# Patient Record
Sex: Female | Born: 1975
Health system: Southern US, Community
[De-identification: ages and names within clinical notes are randomized; demographics above are authoritative.]

## PROBLEM LIST (undated history)

## (undated) DIAGNOSIS — R220 Localized swelling, mass and lump, head: Secondary | ICD-10-CM

## (undated) DIAGNOSIS — Z880 Allergy status to penicillin: Secondary | ICD-10-CM

## (undated) DIAGNOSIS — K219 Gastro-esophageal reflux disease without esophagitis: Secondary | ICD-10-CM

## (undated) DIAGNOSIS — R05 Cough: Secondary | ICD-10-CM

## (undated) DIAGNOSIS — L409 Psoriasis, unspecified: Secondary | ICD-10-CM

## (undated) DIAGNOSIS — O133 Gestational [pregnancy-induced] hypertension without significant proteinuria, third trimester: Secondary | ICD-10-CM

## (undated) DIAGNOSIS — L130 Dermatitis herpetiformis: Secondary | ICD-10-CM

## (undated) DIAGNOSIS — R058 Other specified cough: Secondary | ICD-10-CM

## (undated) DIAGNOSIS — J309 Allergic rhinitis, unspecified: Secondary | ICD-10-CM

## (undated) DIAGNOSIS — E739 Lactose intolerance, unspecified: Secondary | ICD-10-CM

## (undated) DIAGNOSIS — R002 Palpitations: Secondary | ICD-10-CM

## (undated) DIAGNOSIS — A609 Anogenital herpesviral infection, unspecified: Secondary | ICD-10-CM

## (undated) DIAGNOSIS — E559 Vitamin D deficiency, unspecified: Secondary | ICD-10-CM

## (undated) HISTORY — DX: Localized swelling, mass and lump, head: R22.0

## (undated) HISTORY — DX: Other specified cough: R05.8

## (undated) HISTORY — DX: Vitamin D deficiency, unspecified: E55.9

## (undated) HISTORY — DX: Allergy status to penicillin: Z88.0

## (undated) HISTORY — DX: Gastro-esophageal reflux disease without esophagitis: K21.9

## (undated) HISTORY — DX: Allergic rhinitis, unspecified: J30.9

## (undated) HISTORY — DX: Palpitations: R00.2

## (undated) HISTORY — DX: Dermatitis herpetiformis: L13.0

## (undated) HISTORY — DX: Cough: R05

## (undated) HISTORY — DX: Anogenital herpesviral infection, unspecified: A60.9

---

## 2003-03-13 HISTORY — PX: LEEP: SHX91

## 2005-01-04 ENCOUNTER — Other Ambulatory Visit: Admission: RE | Admit: 2005-01-04 | Discharge: 2005-01-04 | Payer: Self-pay | Admitting: Obstetrics and Gynecology

## 2006-01-24 ENCOUNTER — Other Ambulatory Visit: Admission: RE | Admit: 2006-01-24 | Discharge: 2006-01-24 | Payer: Self-pay | Admitting: Obstetrics and Gynecology

## 2006-12-16 ENCOUNTER — Other Ambulatory Visit: Admission: RE | Admit: 2006-12-16 | Discharge: 2006-12-16 | Payer: Self-pay | Admitting: Obstetrics and Gynecology

## 2007-03-13 HISTORY — PX: FEMORAL HERNIA REPAIR: SUR1179

## 2007-04-16 ENCOUNTER — Ambulatory Visit (HOSPITAL_BASED_OUTPATIENT_CLINIC_OR_DEPARTMENT_OTHER): Admission: RE | Admit: 2007-04-16 | Discharge: 2007-04-16 | Payer: Self-pay | Admitting: General Surgery

## 2007-04-16 ENCOUNTER — Encounter (HOSPITAL_BASED_OUTPATIENT_CLINIC_OR_DEPARTMENT_OTHER): Payer: Self-pay | Admitting: General Surgery

## 2008-01-14 ENCOUNTER — Encounter: Payer: Self-pay | Admitting: Obstetrics and Gynecology

## 2008-01-14 ENCOUNTER — Ambulatory Visit: Payer: Self-pay | Admitting: Obstetrics and Gynecology

## 2008-01-14 ENCOUNTER — Other Ambulatory Visit: Admission: RE | Admit: 2008-01-14 | Discharge: 2008-01-14 | Payer: Self-pay | Admitting: Obstetrics and Gynecology

## 2008-02-24 ENCOUNTER — Ambulatory Visit: Payer: Self-pay | Admitting: Gastroenterology

## 2008-02-24 DIAGNOSIS — K921 Melena: Secondary | ICD-10-CM

## 2008-04-12 HISTORY — PX: COLONOSCOPY: SHX174

## 2008-04-13 ENCOUNTER — Ambulatory Visit: Payer: Self-pay | Admitting: Gastroenterology

## 2008-06-02 ENCOUNTER — Telehealth: Payer: Self-pay | Admitting: Gastroenterology

## 2008-06-04 ENCOUNTER — Ambulatory Visit: Payer: Self-pay | Admitting: Gastroenterology

## 2008-06-04 DIAGNOSIS — K602 Anal fissure, unspecified: Secondary | ICD-10-CM

## 2008-06-09 ENCOUNTER — Telehealth: Payer: Self-pay | Admitting: Nurse Practitioner

## 2009-01-24 ENCOUNTER — Encounter: Payer: Self-pay | Admitting: Obstetrics and Gynecology

## 2009-01-24 ENCOUNTER — Ambulatory Visit: Payer: Self-pay | Admitting: Obstetrics and Gynecology

## 2009-01-24 ENCOUNTER — Other Ambulatory Visit: Admission: RE | Admit: 2009-01-24 | Discharge: 2009-01-24 | Payer: Self-pay | Admitting: Obstetrics and Gynecology

## 2010-02-17 ENCOUNTER — Ambulatory Visit: Payer: Self-pay | Admitting: Obstetrics and Gynecology

## 2010-02-17 ENCOUNTER — Other Ambulatory Visit
Admission: RE | Admit: 2010-02-17 | Discharge: 2010-02-17 | Payer: Self-pay | Source: Home / Self Care | Admitting: Obstetrics and Gynecology

## 2010-02-20 ENCOUNTER — Encounter
Admission: RE | Admit: 2010-02-20 | Discharge: 2010-02-20 | Payer: Self-pay | Source: Home / Self Care | Attending: Internal Medicine | Admitting: Internal Medicine

## 2010-03-15 ENCOUNTER — Ambulatory Visit
Admission: RE | Admit: 2010-03-15 | Discharge: 2010-03-15 | Payer: Self-pay | Source: Home / Self Care | Attending: Obstetrics and Gynecology | Admitting: Obstetrics and Gynecology

## 2010-03-17 ENCOUNTER — Ambulatory Visit
Admission: RE | Admit: 2010-03-17 | Discharge: 2010-03-17 | Payer: Self-pay | Source: Home / Self Care | Attending: Obstetrics and Gynecology | Admitting: Obstetrics and Gynecology

## 2010-03-24 LAB — RPR: RPR: NONREACTIVE

## 2010-03-24 LAB — ABO/RH

## 2010-03-24 LAB — HIV ANTIBODY (ROUTINE TESTING W REFLEX): HIV: NONREACTIVE

## 2010-07-25 NOTE — Op Note (Signed)
Sharon Trujillo, ARD NO.:  [id number]   MEDICAL RECORD NO.:  [id number]          PATIENT TYPE:  AMB   LOCATION:  DSC                          FACILITY:  [facility]   PHYSICIAN:  Leonie Man, M.D.   DATE OF BIRTH:  May 05, 1975   DATE OF PROCEDURE:  04/16/2007  DATE OF DISCHARGE:                               OPERATIVE REPORT   PREOPERATIVE DIAGNOSIS:  Left femoral hernia.   POSTOPERATIVE DIAGNOSIS:  Left femoral hernia plus hypertrophic lymph  node.   SURGEON:  Leonie Man, M.D.   ASSISTANT:  OR tech   ANESTHESIA:  General.   NOTE:  The patient is a 35 year old female who presents with pain in the  left groin in the region of the femoral canal who, on palpation and  examination, was noted to have a small mass in the region of his femoral  canal just medial to the femoral vein.  This is painful on palpation.  The mass has been waxing and waning for sometime and felt to be a  femoral hernia.  The patient comes to the operating room now after the  risks and potential benefits of surgery have been discussed, all  questions answered, and consent obtained for surgery.   PROCEDURE:  The patient was positioned supinely following the induction  of general anesthesia and the left groin was prepped and draped to be  included in the sterile operative field.  Positive identification of the  patient as [name] and the procedure as left femoral hernia was  carried out.  I then made an oblique incision inferior to the inguinal  ligament on the left side deepening this through the skin and  subcutaneous tissue, dissecting down to the femoral canal. At the  entrance to the region just medial to the femoral sheath, there was  noted to be a lymph  node which may have been the nodule that was  palpated during the course of her evaluation.  This was dissected free  and forwarded for pathologic evaluation.  The femoral canal was then  palpated.  I did not find a sac  within the femoral canal, but the canal,  itself, was somewhat enlarged.  We, therefore, went ahead and closed the  femoral canal by placing a single stitch joining the inguinal ligament,  the pectin pubis, and the medial aspect of the femoral sheath, to close  the space in the canal.  This was done with a 2-0 Novofil suture.  All  areas of dissection were then checked for hemostasis and noted to be  dry.  Sponge and instrument counts were verified.  The subcutaneous  tissues were closed with a running 3-0 Vicryl suture and the skin was  closed with a 4-0 Monocryl suture reinforced with Steri-Strips.  Sterile  dressings were applied, the anesthetic reversed, and the patient removed  from the operating room to the recovery room in stable condition, having  tolerated the procedure well.      Leonie Man, M.D.  Electronically Signed     PB/MEDQ  D:  04/16/2007  T:  04/16/2007  Job:  [id number]

## 2010-08-11 ENCOUNTER — Inpatient Hospital Stay (HOSPITAL_COMMUNITY): Admission: AD | Admit: 2010-08-11 | Payer: Self-pay | Source: Ambulatory Visit | Admitting: Obstetrics and Gynecology

## 2010-09-25 LAB — STREP B DNA PROBE: GBS: NEGATIVE

## 2010-11-06 ENCOUNTER — Telehealth (HOSPITAL_COMMUNITY): Payer: Self-pay | Admitting: *Deleted

## 2010-11-06 ENCOUNTER — Encounter (HOSPITAL_COMMUNITY): Payer: Self-pay | Admitting: *Deleted

## 2010-11-06 NOTE — Telephone Encounter (Signed)
Preadmission screen  

## 2010-11-08 ENCOUNTER — Inpatient Hospital Stay (HOSPITAL_COMMUNITY)
Admission: AD | Admit: 2010-11-08 | Discharge: 2010-11-12 | DRG: 371 | Disposition: A | Discharge status: Home / Self Care | Payer: BC Managed Care – PPO | Source: Ambulatory Visit | Attending: Obstetrics and Gynecology | Admitting: Obstetrics and Gynecology

## 2010-11-08 ENCOUNTER — Inpatient Hospital Stay (HOSPITAL_COMMUNITY): Admission: RE | Admit: 2010-11-08 | Payer: Self-pay | Source: Ambulatory Visit

## 2010-11-08 DIAGNOSIS — K921 Melena: Secondary | ICD-10-CM

## 2010-11-08 DIAGNOSIS — K602 Anal fissure, unspecified: Secondary | ICD-10-CM

## 2010-11-08 DIAGNOSIS — O4100X0 Oligohydramnios, unspecified trimester, not applicable or unspecified: Secondary | ICD-10-CM | POA: Diagnosis present

## 2010-11-08 DIAGNOSIS — O139 Gestational [pregnancy-induced] hypertension without significant proteinuria, unspecified trimester: Secondary | ICD-10-CM | POA: Diagnosis present

## 2010-11-08 DIAGNOSIS — O480 Post-term pregnancy: Secondary | ICD-10-CM | POA: Diagnosis present

## 2010-11-08 DIAGNOSIS — O133 Gestational [pregnancy-induced] hypertension without significant proteinuria, third trimester: Secondary | ICD-10-CM

## 2010-11-08 DIAGNOSIS — K625 Hemorrhage of anus and rectum: Secondary | ICD-10-CM

## 2010-11-08 HISTORY — DX: Gestational (pregnancy-induced) hypertension without significant proteinuria, third trimester: O13.3

## 2010-11-08 LAB — CBC
HCT: 38.4 % (ref 36.0–46.0)
HCT: 36.9 % (ref 36.0–46.0)
Hemoglobin: 12.7 g/dL (ref 12.0–15.0)
MCHC: 34.4 g/dL (ref 30.0–36.0)
MCV: 97.2 fL (ref 78.0–100.0)
Platelets: 198 10*3/uL (ref 150–400)
RBC: 3.95 MIL/uL (ref 3.87–5.11)
RBC: 3.8 MIL/uL — ABNORMAL LOW (ref 3.87–5.11)
WBC: 8.6 10*3/uL (ref 4.0–10.5)

## 2010-11-08 LAB — URIC ACID: Uric Acid, Serum: 6.5 mg/dL (ref 2.4–7.0)

## 2010-11-08 LAB — COMPREHENSIVE METABOLIC PANEL
ALT: 19 U/L (ref 0–35)
Albumin: 3.2 g/dL — ABNORMAL LOW (ref 3.5–5.2)
Alkaline Phosphatase: 197 U/L — ABNORMAL HIGH (ref 39–117)
BUN: 10 mg/dL (ref 6–23)
Chloride: 101 mEq/L (ref 96–112)
Glucose, Bld: 86 mg/dL (ref 70–99)
Potassium: 4.2 mEq/L (ref 3.5–5.1)
Sodium: 132 mEq/L — ABNORMAL LOW (ref 135–145)
Total Bilirubin: 0.6 mg/dL (ref 0.3–1.2)

## 2010-11-08 LAB — LACTATE DEHYDROGENASE
LDH: 259 U/L — ABNORMAL HIGH (ref 94–250)
LDH: 209 U/L (ref 94–250)

## 2010-11-08 LAB — ALT: ALT: 19 U/L (ref 0–35)

## 2010-11-08 MED ORDER — IBUPROFEN 600 MG PO TABS: 600.0000 mg | ORAL_TABLET | Freq: Four times a day (QID) | ORAL | Status: DC | PRN

## 2010-11-08 MED ORDER — TERBUTALINE SULFATE 1 MG/ML IJ SOLN: 0.2500 mg | Freq: Once | INTRAMUSCULAR | Status: AC | PRN

## 2010-11-08 MED ORDER — OXYTOCIN 20 UNITS IN LACTATED RINGERS INFUSION - SIMPLE
1.0000 m[IU]/min | INTRAVENOUS | Status: DC
  Administered 2010-11-09: 12 m[IU]/min via INTRAVENOUS
  Administered 2010-11-09: 2 m[IU]/min via INTRAVENOUS

## 2010-11-08 MED ORDER — LACTATED RINGERS IV SOLN
INTRAVENOUS | Status: DC
  Administered 2010-11-09 (×4): via INTRAVENOUS

## 2010-11-08 MED ORDER — LIDOCAINE HCL (PF) 1 % IJ SOLN: 30.0000 mL | INTRAMUSCULAR | Status: DC | PRN

## 2010-11-08 MED ORDER — OXYTOCIN 20 UNITS IN LACTATED RINGERS INFUSION - SIMPLE: 125.0000 mL/h | INTRAVENOUS | Status: AC

## 2010-11-08 MED ORDER — LACTATED RINGERS IV SOLN
500.0000 mL | INTRAVENOUS | Status: DC | PRN
  Administered 2010-11-09: 500 mL via INTRAVENOUS

## 2010-11-08 MED ORDER — OXYCODONE-ACETAMINOPHEN 5-325 MG PO TABS: 2.0000 | ORAL_TABLET | ORAL | Status: DC | PRN

## 2010-11-08 MED ORDER — ONDANSETRON HCL 4 MG/2ML IJ SOLN
4.0000 mg | Freq: Four times a day (QID) | INTRAMUSCULAR | Status: DC | PRN
  Administered 2010-11-09 (×2): 4 mg via INTRAVENOUS
  Filled 2010-11-08 (×2): qty 2

## 2010-11-08 MED ORDER — ZOLPIDEM TARTRATE 10 MG PO TABS
10.0000 mg | ORAL_TABLET | Freq: Every evening | ORAL | Status: DC | PRN
  Administered 2010-11-08: 10 mg via ORAL
  Filled 2010-11-08: qty 1

## 2010-11-08 MED ORDER — FLEET ENEMA 7-19 GM/118ML RE ENEM: 1.0000 | ENEMA | RECTAL | Status: DC | PRN

## 2010-11-08 MED ORDER — OXYTOCIN BOLUS FROM INFUSION
500.0000 mL | Freq: Once | INTRAVENOUS | Status: DC
  Filled 2010-11-08: qty 500

## 2010-11-08 MED ORDER — ACETAMINOPHEN 325 MG PO TABS: 650.0000 mg | ORAL_TABLET | ORAL | Status: DC | PRN

## 2010-11-08 MED ORDER — CITRIC ACID-SODIUM CITRATE 334-500 MG/5ML PO SOLN
30.0000 mL | ORAL | Status: DC | PRN
  Administered 2010-11-09: 30 mL via ORAL
  Filled 2010-11-08 (×2): qty 15

## 2010-11-08 MED ORDER — DINOPROSTONE 10 MG VA INST
10.0000 mg | VAGINAL_INSERT | Freq: Once | VAGINAL | Status: AC
  Administered 2010-11-08: 10 mg via VAGINAL
  Filled 2010-11-08: qty 1

## 2010-11-08 NOTE — Progress Notes (Signed)
Dr Billy Coast at bedside to discuss with patient POC. Stated to call with PIH lab results at 2300.

## 2010-11-08 NOTE — Progress Notes (Signed)
Sharon Trujillo is a 35 y.o. G1P0 at [redacted]w[redacted]d by LMP admitted for induction of labor due to Post dates. Due date 11/01/10, Low amniotic fluid. and Hypertension.,   Subjective: Denies HA, visual changes or epigastric pain. Good FM.   Objective: BP 139/94  Pulse 83  Temp(Src) 98.5 F (36.9 C) (Oral)  Resp 18  Ht 5\' 11"  (1.803 m)  Wt 95.709 kg (211 lb)  BMI 29.43 kg/m2  LMP 01/26/2010 Bp range 135-153/89-103      FHT:  FHR: 150 bpm, variability: moderate,  accelerations:  Present,  decelerations:  Absent UC:   none SVE:   Dilation: Closed Effacement (%): Thick Station: -3 Exam by:: Mickie Bail Cervidil placed  Labs: LDH 259 and UA 6.6 Lab Results  Component Value Date   WBC 8.6 11/08/2010   HGB 13.1 11/08/2010   HCT 38.4 11/08/2010   MCV 97.2 11/08/2010   PLT 198 11/08/2010    Assessment / Plan: Induction of labor due to gestational hypertension and Oligohydramnios,  progressing well on pitocin to start in AM. Cervidil now.  Labor: Progressing normally Preeclampsia:  no signs or symptoms of toxicity, intake and ouput balanced and labs stable Fetal Wellbeing:  Category I Pain Control:  Labor support without medications I/D:  n/a Anticipated MOD:  NSVD Repeat PIH labs at 2300 PEC precautions.Lenoard Aden 11/08/2010, 9:35 PM

## 2010-11-08 NOTE — Progress Notes (Signed)
Pt due to be placed on monitor. Pt currently in shower. Asked to call when she was finished to be placed on monitor.

## 2010-11-08 NOTE — H&P (Signed)
NAMESAPPHIRE, TYGART NO.:  [id number]  MEDICAL RECORD NO.:  [id number]  LOCATION:  9163                          FACILITY:  [facility]  PHYSICIAN:  Lenoard Aden, M.D.DATE OF BIRTH:  Mar 29, 1975  DATE OF ADMISSION:  11/08/2010 DATE OF DISCHARGE:                             HISTORY & PHYSICAL   CHIEF COMPLAINT:  Postdates with oligohydramnios for induction.  HISTORY OF PRESENT ILLNESS:  A 35 year old white female G1, P0 who is at 40-6/7 weeks' gestation with a grade 3 placenta and AFI of 4.7 for cervical ripening and induction.  She has allergies to PENICILLIN, SULFONAMIDES, including DERIVATIVES.  She is a nonsmoker, nondrinker. She denies domestic or physical violence.  She has a family history of breast cancer and bladder cancer.  Surgical history of a femoral hernia repair and a LEEP.  Medications include Nexium, Valtrex, prenatal vitamins.  Prenatal course has been complicated by size and dates discrepancy with estimated fetal weight of 9 pounds, oligohydramnios was noted and postdate status.  PHYSICAL EXAMINATION:  GENERAL:  On physical exam, she is a well- developed, well-nourished white female in no acute distress. HEENT:  Normal. NECK:  Supple.  Full range of motion. LUNGS:  Clear. HEART:  Regular rhythm. ABDOMEN:  Soft, gravid, and nontender.  Estimated fetal weight 8-1/2 pound.  Cervix is closed, 70%, vertex, -1. EXTREMITIES:  No cords. NEUROLOGICAL:  Nonfocal. SKIN:  Intact.  DTRs 2+, no evidence of clonus.  LABORATORY DATA:  Labs reveal platelet count of 198,000.  LDH of 259, SGOT and SGPT normal.  Rest of her labs are within normal limits.  Blood pressures have been ranging from 140 to 153/90-103.  IMPRESSION: 1. A 41-week intrauterine pregnancy. 2. Oligohydramnios with grade 3 placenta. 3. Questionable new onset gestational hypertension versus mild     preeclampsia with elevated LDH.  PLAN:  Cervidil placed, reactive NST noted, repeat  labs in 6 hours Pitocin status post Cervidil, anticipate cautious attempts at vaginal delivery.     Lenoard Aden, M.D.     RJT/MEDQ  D:  11/08/2010  T:  11/08/2010  Job:  [id number]

## 2010-11-09 ENCOUNTER — Encounter (HOSPITAL_COMMUNITY): Payer: Self-pay | Admitting: *Deleted

## 2010-11-09 ENCOUNTER — Encounter (HOSPITAL_COMMUNITY): Payer: Self-pay | Admitting: Anesthesiology

## 2010-11-09 ENCOUNTER — Encounter (HOSPITAL_COMMUNITY)
Admission: AD | Disposition: A | Discharge status: Home / Self Care | Payer: Self-pay | Source: Ambulatory Visit | Attending: Obstetrics and Gynecology

## 2010-11-09 ENCOUNTER — Inpatient Hospital Stay (HOSPITAL_COMMUNITY): Payer: BC Managed Care – PPO | Admitting: Anesthesiology

## 2010-11-09 ENCOUNTER — Encounter (HOSPITAL_COMMUNITY): Payer: Self-pay

## 2010-11-09 DIAGNOSIS — O133 Gestational [pregnancy-induced] hypertension without significant proteinuria, third trimester: Secondary | ICD-10-CM | POA: Diagnosis present

## 2010-11-09 DIAGNOSIS — O480 Post-term pregnancy: Secondary | ICD-10-CM | POA: Diagnosis present

## 2010-11-09 HISTORY — DX: Gestational (pregnancy-induced) hypertension without significant proteinuria, third trimester: O13.3

## 2010-11-09 LAB — URIC ACID: Uric Acid, Serum: 6.4 mg/dL (ref 2.4–7.0)

## 2010-11-09 LAB — CBC
HCT: 37 % (ref 36.0–46.0)
Hemoglobin: 12.8 g/dL (ref 12.0–15.0)
Hemoglobin: 12.8 g/dL (ref 12.0–15.0)
MCH: 33.2 pg (ref 26.0–34.0)
MCH: 33.2 pg (ref 26.0–34.0)
MCHC: 34.3 g/dL (ref 30.0–36.0)
MCHC: 34.6 g/dL (ref 30.0–36.0)
MCV: 95.9 fL (ref 78.0–100.0)
Platelets: 190 10*3/uL (ref 150–400)
Platelets: 209 10*3/uL (ref 150–400)
RBC: 3.86 MIL/uL — ABNORMAL LOW (ref 3.87–5.11)
RDW: 12.9 % (ref 11.5–15.5)
RDW: 12.8 % (ref 11.5–15.5)
WBC: 18.7 10*3/uL — ABNORMAL HIGH (ref 4.0–10.5)

## 2010-11-09 LAB — LACTATE DEHYDROGENASE: LDH: 211 U/L (ref 94–250)

## 2010-11-09 LAB — ALT: ALT: 19 U/L (ref 0–35)

## 2010-11-09 LAB — AST: AST: 23 U/L (ref 0–37)

## 2010-11-09 SURGERY — Surgical Case
Anesthesia: Regional

## 2010-11-09 SURGERY — Surgical Case
Anesthesia: Regional | Wound class: Clean Contaminated

## 2010-11-09 MED ORDER — PHENYLEPHRINE 40 MCG/ML (10ML) SYRINGE FOR IV PUSH (FOR BLOOD PRESSURE SUPPORT)
80.0000 ug | PREFILLED_SYRINGE | INTRAVENOUS | Status: DC | PRN
  Filled 2010-11-09: qty 5

## 2010-11-09 MED ORDER — LACTATED RINGERS IV SOLN
INTRAVENOUS | Status: DC | PRN
  Administered 2010-11-09 (×2): via INTRAVENOUS

## 2010-11-09 MED ORDER — EPHEDRINE 5 MG/ML INJ
10.0000 mg | INTRAVENOUS | Status: DC | PRN
  Filled 2010-11-09 (×2): qty 4

## 2010-11-09 MED ORDER — PHENYLEPHRINE 40 MCG/ML (10ML) SYRINGE FOR IV PUSH (FOR BLOOD PRESSURE SUPPORT)
PREFILLED_SYRINGE | INTRAVENOUS | Status: AC
  Filled 2010-11-09: qty 10

## 2010-11-09 MED ORDER — MORPHINE SULFATE (PF) 0.5 MG/ML IJ SOLN
INTRAMUSCULAR | Status: DC | PRN
  Administered 2010-11-09: 1 mg via INTRAVENOUS

## 2010-11-09 MED ORDER — CLINDAMYCIN PHOSPHATE 600 MG/50ML IV SOLN
INTRAVENOUS | Status: DC | PRN
  Administered 2010-11-09: 900 mg via INTRAVENOUS

## 2010-11-09 MED ORDER — PHENYLEPHRINE HCL 10 MG/ML IJ SOLN
INTRAMUSCULAR | Status: DC | PRN
  Administered 2010-11-09 (×2): 80 ug via INTRAVENOUS

## 2010-11-09 MED ORDER — PHENYLEPHRINE 40 MCG/ML (10ML) SYRINGE FOR IV PUSH (FOR BLOOD PRESSURE SUPPORT)
80.0000 ug | PREFILLED_SYRINGE | INTRAVENOUS | Status: DC | PRN
  Filled 2010-11-09 (×2): qty 5

## 2010-11-09 MED ORDER — MORPHINE SULFATE 0.5 MG/ML IJ SOLN
INTRAMUSCULAR | Status: AC
  Filled 2010-11-09: qty 10

## 2010-11-09 MED ORDER — EPHEDRINE 5 MG/ML INJ
10.0000 mg | INTRAVENOUS | Status: DC | PRN
  Filled 2010-11-09: qty 4

## 2010-11-09 MED ORDER — LACTATED RINGERS IV SOLN: 500.0000 mL | Freq: Once | INTRAVENOUS | Status: DC

## 2010-11-09 MED ORDER — LIDOCAINE-EPINEPHRINE (PF) 2 %-1:200000 IJ SOLN
INTRAMUSCULAR | Status: AC
  Filled 2010-11-09: qty 40

## 2010-11-09 MED ORDER — CLINDAMYCIN PHOSPHATE 900 MG/50ML IV SOLN
900.0000 mg | Freq: Once | INTRAVENOUS | Status: DC
  Filled 2010-11-09: qty 50

## 2010-11-09 MED ORDER — MEPERIDINE HCL 25 MG/ML IJ SOLN
INTRAMUSCULAR | Status: AC
  Filled 2010-11-09: qty 1

## 2010-11-09 MED ORDER — MORPHINE SULFATE (PF) 0.5 MG/ML IJ SOLN
INTRAMUSCULAR | Status: DC | PRN
  Administered 2010-11-09: 4 mg via EPIDURAL

## 2010-11-09 MED ORDER — LIDOCAINE-EPINEPHRINE 2 %-1:100000 IJ SOLN
INTRAMUSCULAR | Status: DC | PRN
  Administered 2010-11-09: 20 mL via INTRADERMAL

## 2010-11-09 MED ORDER — OXYTOCIN 20 UNITS IN LACTATED RINGERS INFUSION - SIMPLE
INTRAVENOUS | Status: DC | PRN
  Administered 2010-11-09: 40 [IU] via INTRAVENOUS
  Administered 2010-11-09: 20 [IU] via INTRAVENOUS

## 2010-11-09 MED ORDER — MEPERIDINE HCL 25 MG/ML IJ SOLN
INTRAMUSCULAR | Status: DC | PRN
  Administered 2010-11-09: 25 mg via INTRAVENOUS

## 2010-11-09 MED ORDER — LACTATED RINGERS IV SOLN
INTRAVENOUS | Status: DC
  Administered 2010-11-09: 1000 mL via INTRAUTERINE

## 2010-11-09 MED ORDER — FENTANYL 2.5 MCG/ML BUPIVACAINE 1/10 % EPIDURAL INFUSION (WH - ANES)
14.0000 mL/h | INTRAMUSCULAR | Status: DC
  Administered 2010-11-09: 16 mL/h via EPIDURAL
  Administered 2010-11-09: 14 mL/h via EPIDURAL
  Filled 2010-11-09 (×2): qty 60

## 2010-11-09 MED ORDER — BUPIVACAINE HCL (PF) 0.25 % IJ SOLN
INTRAMUSCULAR | Status: DC | PRN
  Administered 2010-11-09: 20 mL

## 2010-11-09 MED ORDER — ONDANSETRON HCL 4 MG/2ML IJ SOLN
INTRAMUSCULAR | Status: AC
  Filled 2010-11-09: qty 2

## 2010-11-09 MED ORDER — DIPHENHYDRAMINE HCL 50 MG/ML IJ SOLN: 12.5000 mg | INTRAMUSCULAR | Status: DC | PRN

## 2010-11-09 SURGICAL SUPPLY — 28 items
CLOTH BEACON ORANGE TIMEOUT ST (SAFETY) ×2 IMPLANT
CONTAINER PREFILL 10% NBF 15ML (MISCELLANEOUS) IMPLANT
DRESSING TELFA 8X3 (GAUZE/BANDAGES/DRESSINGS) IMPLANT
ELECT REM PT RETURN 9FT ADLT (ELECTROSURGICAL) ×2
ELECTRODE REM PT RTRN 9FT ADLT (ELECTROSURGICAL) ×1 IMPLANT
EXTRACTOR VACUUM M CUP 4 TUBE (SUCTIONS) IMPLANT
GAUZE SPONGE 4X4 12PLY STRL LF (GAUZE/BANDAGES/DRESSINGS) ×4 IMPLANT
GLOVE BIO SURGEON STRL SZ7.5 (GLOVE) ×4 IMPLANT
GOWN PREVENTION PLUS LG XLONG (DISPOSABLE) ×4 IMPLANT
GOWN PREVENTION PLUS XLARGE (GOWN DISPOSABLE) ×2 IMPLANT
KIT ABG SYR 3ML LUER SLIP (SYRINGE) IMPLANT
NEEDLE HYPO 25X1 1.5 SAFETY (NEEDLE) ×2 IMPLANT
NEEDLE HYPO 25X5/8 SAFETYGLIDE (NEEDLE) IMPLANT
NS IRRIG 1000ML POUR BTL (IV SOLUTION) ×2 IMPLANT
PACK C SECTION WH (CUSTOM PROCEDURE TRAY) ×2 IMPLANT
PAD ABD 7.5X8 STRL (GAUZE/BANDAGES/DRESSINGS) IMPLANT
SLEEVE SCD COMPRESS KNEE MED (MISCELLANEOUS) IMPLANT
STAPLER VISISTAT 35W (STAPLE) ×2 IMPLANT
SUT MNCRL 0 VIOLET CTX 36 (SUTURE) ×2 IMPLANT
SUT MON AB 2-0 CT1 27 (SUTURE) ×2 IMPLANT
SUT MON AB-0 CT1 36 (SUTURE) ×4 IMPLANT
SUT MONOCRYL 0 CTX 36 (SUTURE) ×2
SUT PLAIN 0 NONE (SUTURE) IMPLANT
SUT PLAIN 2 0 XLH (SUTURE) IMPLANT
SYR CONTROL 10ML LL (SYRINGE) ×2 IMPLANT
TOWEL OR 17X24 6PK STRL BLUE (TOWEL DISPOSABLE) ×4 IMPLANT
TRAY FOLEY CATH 14FR (SET/KITS/TRAYS/PACK) ×2 IMPLANT
WATER STERILE IRR 1000ML POUR (IV SOLUTION) ×2 IMPLANT

## 2010-11-09 NOTE — Progress Notes (Signed)
Pt states she would rather keep efm on while she is sleeping to prevent being woken up hourly.

## 2010-11-09 NOTE — Progress Notes (Signed)
Pt sitting up in bed. Leaning forward with uc's. fhr tracing maternal at times. toco adj. uc's not tracing out very well.

## 2010-11-09 NOTE — Progress Notes (Signed)
Dr Billy Coast notified of PIH results. Order to repeat in 6 hours.

## 2010-11-09 NOTE — Progress Notes (Signed)
  S: Feeling well - no pain     Frustrated with labor   O:  VS: Blood pressure 125/76, pulse 103, temperature 98.2 F (36.8 C), temperature source Oral, resp. rate 20, height 5\' 11"  (1.803 m), weight 95.709 kg (211 lb), last menstrual period 01/26/2010, SpO2 100.00%.        FHR : baseline 145 / variability moderate / accels +  / decels none        EFM: Category 1        Toco: contractions every 2-4 minutes / mild / MVU 60-100 / pitocin at 46mu/min        Cervix : edematous with anterior cervix 60% now with swollen os now at 3cm / vtx / o station with caput        Good return from amnioinfusion - + show  A: protracted labor - inadequate pattern despite augmentation - arrested labor     FHR decels resolved with amnioinfusion     Arrest of dilatation   P: DC pitocin      Dr Billy Coast called for CS      Preop started for CS      Discussed with patient - inadequate ctx / vtx at O station with increasing molding of fetal head - now with edematous cervix. Recommend CS delivery - suspect CPD.      Reviewed guidance for CS / postoperative care / risks of surgery : trauma to nearby organs, bleeding, infection, with significant bleeding risk of possible blood transfusion.      Understands - tearful with frustration and disappointment - no questions at this time - states feels good that she tried everything.     Sharon Trujillo 11/09/2010, 10:17 PM

## 2010-11-09 NOTE — Progress Notes (Signed)
  S: Feeling much better with epidural / back pain resolved     Tolerating contractions well now   O:  VS: Blood pressure 114/64, pulse 105, temperature 98.5 F (36.9 C), temperature source Oral, resp. rate 20, height 5\' 11"  (1.803 m), weight 95.709 kg (211 lb), last menstrual period 01/26/2010, SpO2 97.00%.        FHR : baseline 145 / variability moderate / accels + / decels after epidural - resolved after BP stabilized         EFM: Category 2        Toco: contractions every 2-5 minutes / mild / MVU 90        Cervix : 4-5 cm / 100 % / vtx / 0 / show+        Membranes: scant fluid  A: active labor - protracted  P: Restart pitocin at 10 mu/min - increase to adequate MVUs     Recheck progression in 2 hours     Discussed possible cs if unable to establish adequate labor / arrest of dilation / arrest of descent - understands / desires Sharon Trujillo if possible if CS needed     [name] 11/09/2010, 6:39 PM

## 2010-11-09 NOTE — Progress Notes (Signed)
Decision made to go ahead with c/section d/t swollen cervix.

## 2010-11-09 NOTE — Op Note (Signed)
Cesarean Section Procedure Note  Indications: failure to progress: arrest of dilation and OP  Pre-operative Diagnosis: 41 week 0 day pregnancy.  Post-operative Diagnosis: same  Surgeon: Lenoard Aden   Assistants: Fredric Mare  Anesthesia: Epidural anesthesia  ASA Class: 2  Procedure Details  The patient was seen in the Holding Room. The risks, benefits, complications, treatment options, and expected outcomes were discussed with the patient.  The patient concurred with the proposed plan, giving informed consent. The risks of anesthesia, infection, bleeding and possible injury to other organs discussed. Injury to bowel, bladder, or ureter with possible need for repair discussed. Possible need for transfusion with secondary risks of hepatitis or HIV acquisition discussed. Post operative complications to include but not limited to DVT, PE and Pneumonia noted. The site of surgery properly noted/marked. The patient was taken to Operating Room # 1, identified as [facility] and the procedure verified as C-Section Delivery. A Time Out was held and the above information confirmed.  After induction of anesthesia, the patient was draped and prepped in the usual sterile manner. A Pfannenstiel incision was made and carried down through the subcutaneous tissue to the fascia. Fascial incision was made and extended transversely using Mayo scissors. The fascia was separated from the underlying rectus tissue superiorly and inferiorly. The peritoneum was identified and entered. Peritoneal incision was extended longitudinally. The utero-vesical peritoneal reflection was incised transversely and the bladder flap was bluntly freed from the lower uterine segment. A low transverse uterine incision(Kerr hysterotomy) was made. Delivered from OP presentation was a  female with Apgar scores of 7 at one minute and 8 at five minutes.Shoulder cord x 2 was noted but loose. After the umbilical cord was clamped and cut cord blood  was obtained for evaluation. The placenta was removed intact and appeared normal. Mild uterine atony was noted.The uterine outline, tubes and ovaries appeared normal.  The uterine incision was closed with running locked sutures of 0 Monocryl x 2 layers. Hemostasis was observed. Lavage was carried out until clear. Peritoneum closed with 2-0 vicryl.The fascia was then reapproximated with running sutures of 0 Monocryl. The skin was reapproximated with staples.  Instrument, sponge, and needle counts were correct prior the abdominal closure and at the conclusion of the case.   Findings: FLTM, OP, 2 layer closure  Estimated Blood Loss:          Drains: Foley                 Specimens: Placenta Cord ph 7.22                 Complications:  None; patient tolerated the procedure well.         Disposition: PACU - hemodynamically stable.         Condition: stable  Attending Attestation: I performed the procedure.

## 2010-11-09 NOTE — Anesthesia Procedure Notes (Signed)
Epidural Patient location during procedure: OB Start time: 11/09/2010 5:38 PM  Staffing Anesthesiologist: Jiles Garter  Preanesthetic Checklist Completed: patient identified, site marked, surgical consent, pre-op evaluation, timeout performed, IV checked, risks and benefits discussed and monitors and equipment checked  Epidural Patient position: sitting Prep: site prepped and draped and DuraPrep Patient monitoring: continuous pulse ox and blood pressure Approach: midline Injection technique: LOR air  Needle:  Needle type: Tuohy  Needle gauge: 17 G Needle length: 9 cm Needle insertion depth: 5 cm cm Catheter type: closed end flexible Catheter size: 19 Gauge Catheter at skin depth: 10 cm Test dose: negative  Assessment Events: blood not aspirated, injection not painful, no injection resistance, negative IV test and no paresthesia  Additional Notes Dosing of Epidural: 1st dose, Through needle...... 5mg  Marcaine 2nd dose, through catheter.... epi 1:200K + Xylocaine 40 mg 3rd dose, through catheter...Marland KitchenMarland Kitchenepi 1:200K + Xylocaine 60 mg Each dose occurred after waiting 3 min,patient was free of IV sx; and patient exhibits no evidence of SA injection  Patient is more comfortable after epidural dosed. Please see RN's note for documentation of vital signs,and FHR which are stable.

## 2010-11-09 NOTE — Progress Notes (Signed)
  S: Doing well, working with ctx using relaxation methods, alternating ambulation/birthing ball and resting in bed, ctx intensity same, back pain(+). Denies H/A, epigastric pain. Nausea and emesis earlier, now resolved since Zofran.  O: BP 148/96  Pulse 83  Temp(Src) 98.3 F (36.8 C) (Oral)  Resp 20  Ht 5\' 11"  (1.803 m)  Wt 211 lb (95.709 kg)  BMI 29.43 kg/m2  LMP 01/26/2010 BP range 140/92  -  153/100  Pit 10 mu/min Results for TELA, KOTECKI (MRN [id number]) as of 11/09/2010 09:28  Ref. Range 11/08/2010 23:00 11/09/2010 07:05  Uric Acid, Serum Latest Range: 2.4-7.0 mg/dL 6.5 6.4  AST Latest Range: 0-37 U/L 21 23  ALT Latest Range: 0-35 U/L 19 19  LD Latest Range: 94-250 U/L 209 211    FHT:  FHR: 125 bpm, variability: moderate,  accelerations:  Present,  decelerations:  Absent UC:   irregular, every 2-5 minutes, distotic pattern SVE:   Deferred   A / P: Induction of labor due to gestational hypertension and postdates, oligo,   No s/s PEC, rpt PIH labs stable Fetal Wellbeing:  Category I Pain Control:  Labor support without medications Plan water birth if no PEC Continue with intermittent rest and change of positioning, Nubain PRN  Anticipated MOD:  NSVD Sharon Trujillo, CNM to follow  Sharon Trujillo 11/09/2010, 9:29 AM

## 2010-11-09 NOTE — Progress Notes (Signed)
S: CTX uncomfortable - breathing hard with ctx      Wants to get into tub     Tolerating contractions fairly well - tearful with ctx and lack of labor progress   O:  VS: Blood pressure 154/97, pulse 92, temperature 98.8 F (37.1 C), temperature source Oral, resp. rate 20, height 5\' 11"  (1.803 m), weight 95.709 kg (211 lb), last menstrual period 01/26/2010.        FHR : baseline 145 / variability moderate / accels + / decels none        EFM: Category 1        Toco: contractions every 2-3 minutes / moderate/ pitocin 14 mu/min        Cervix : 3 cm / 100 % / vtx / o / OP        Membranes: clear - scant amount  A: latent labor     PIH with oligio  P: pitocin induction      Ok to try tub for 1-2 hours and recheck cervix     BAILEY,TANYA 11/09/2010, 4:14 PM

## 2010-11-09 NOTE — Anesthesia Preprocedure Evaluation (Signed)

## 2010-11-09 NOTE — Progress Notes (Signed)
  S: Feeling Well - no pain or pressure     Tolerating contractions well with epidural / turning - repositioning self in bed   O:  VS: Blood pressure 126/67, pulse 90, temperature 98.2 F (36.8 C), temperature source Oral, resp. rate 20, height 5\' 11"  (1.803 m), weight 95.709 kg (211 lb), last menstrual period 01/26/2010, SpO2 98.00%.        FHR : baseline 145 / variability moderate / accels + / decels VARIABLES - NADIR 110 with ctx / spontaneous recovery        EFM: Category 1        Toco: contractions every 4 minutes / mild/ MVU 70-90 / pitocin at 12 mu/min        Cervix : 5 cm / 100 % / vtx / 0 - IUPC placement verified for amnioinfusion         A: Protracted  Labor with variable decels  P: amnioinfusion - 500 ml /hour      Pitocin augmentation     Paiten Boies 11/09/2010, 8:21 PM

## 2010-11-09 NOTE — Progress Notes (Signed)
  S: Feeling frustrated     Tolerating contractions well   O:  VS: Blood pressure 154/97, pulse 92, temperature 98.8 F (37.1 C), temperature source Oral, resp. rate 20, height 5\' 11"  (1.803 m), weight 95.709 kg (211 lb), last menstrual period 01/26/2010.        FHR : baseline 145 / variability moderate / accels + / decels rare variable to 125 nadir        EFM: Category 1        Toco: contractions every 2-3 minutes / mild per IUPC / MVU 90-115 (IUPC last exam 1445 - no cervical change) / pitocin 43mu/min        Cervix : defer recheck this visit        A: latent labor -protracted     PIH  P: Discussed need to increase pitocin      Patient desires epidural at this point - will increase pitocin to adequate MVU after comfortable with epidural      Dr Billy Coast updates     Marlinda Mike 11/09/2010, 4:17 PM

## 2010-11-09 NOTE — Consult Note (Signed)
Neonatology Note:   Attendance at C-section:    I was asked to attend this primary C/S at term due to [facility]. The mother is a G1P0 GBS neg with mild PIH and oligohydramnios. An amnioinfusion was done during labor due to FHR decels and there was moderate meconium in the fluid. At delivery, the cord was around the shoulder and the leg and the baby was hypotonic. Bulb suctioned on the abdomen by Dr. Billy Coast, then the baby was placed on the radiant warmer. We bulb suctioned again for some thin meconium, then gave vigorous stimulation, but there was no response. The HR was about 80 and the baby had no tone. PPV was given for 30 seconds with good response in color and HR. The baby began to cry at 2 minutes of age and regained tone quickly, normal by 4 minutes of age. Ap 3/9. Lungs clear to ausc in DR. To CN to care of Pediatrician.   Deatra James, MD

## 2010-11-09 NOTE — Progress Notes (Signed)
Notified regarding FHR, variables and MVU's. Stated she would come to review strip and decide if patient needs an amnioinfusion. Informed patient of discussion with Kenney Houseman, CNM

## 2010-11-09 NOTE — Progress Notes (Signed)
SAKSHI SERMONS is a 35 y.o. G1P0 at [redacted]w[redacted]d by LMP admitted for induction of labor due to Low amniotic fluid. and Hypertension.  Subjective:Getting uncomfortable Good FM, No HA. No epigastric pain.   Objective: BP 138/89  Pulse 86  Temp(Src) 98.2 F (36.8 C) (Oral)  Resp 18  Ht 5\' 11"  (1.803 m)  Wt 95.709 kg (211 lb)  BMI 29.43 kg/m2  LMP 01/26/2010      FHT:  FHR: 140 bpm, variability: moderate,  accelerations:  Present,  decelerations:  Absent UC:   regular, every 3 minutes SVE:   Dilation: 2 Effacement (%): 70 Station: -1 Exam by:: Georgeanna Harrison, RN AROM- ? clear  Labs: Lab Results  Component Value Date   WBC 11.7* 11/09/2010   HGB 12.8 11/09/2010   HCT 37.3 11/09/2010   MCV 96.6 11/09/2010   PLT 190 11/09/2010    Assessment / Plan: Induction of labor due to gestational hypertension and oligohydramnios,  progressing well on pitocin BP stable. Labs stable.  Labor: Progressing normally Preeclampsia:  no signs or symptoms of toxicity, intake and ouput balanced and labs stable Fetal Wellbeing:  Category I Pain Control:  Labor support without medications I/D:  n/a Anticipated MOD:  NSVD CNM notified for support  TAAVON,RICHARD J 11/09/2010, 7:33 AM

## 2010-11-10 ENCOUNTER — Encounter (HOSPITAL_COMMUNITY): Payer: Self-pay | Admitting: Obstetrics and Gynecology

## 2010-11-10 LAB — CBC
HCT: 30.3 % — ABNORMAL LOW (ref 36.0–46.0)
Hemoglobin: 10.5 g/dL — ABNORMAL LOW (ref 12.0–15.0)
MCH: 33.8 pg (ref 26.0–34.0)
MCHC: 34.7 g/dL (ref 30.0–36.0)
RBC: 3.11 MIL/uL — ABNORMAL LOW (ref 3.87–5.11)

## 2010-11-10 LAB — ABO/RH: ABO/RH(D): O POS

## 2010-11-10 MED ORDER — SIMETHICONE 80 MG PO CHEW
80.0000 mg | CHEWABLE_TABLET | Freq: Three times a day (TID) | ORAL | Status: DC
  Administered 2010-11-10 – 2010-11-12 (×6): 80 mg via ORAL

## 2010-11-10 MED ORDER — SIMETHICONE 80 MG PO CHEW
80.0000 mg | CHEWABLE_TABLET | ORAL | Status: DC | PRN
  Administered 2010-11-11 – 2010-11-12 (×3): 80 mg via ORAL

## 2010-11-10 MED ORDER — METOCLOPRAMIDE HCL 5 MG/ML IJ SOLN: 10.0000 mg | Freq: Three times a day (TID) | INTRAMUSCULAR | Status: DC | PRN

## 2010-11-10 MED ORDER — KETOROLAC TROMETHAMINE 30 MG/ML IJ SOLN: 30.0000 mg | Freq: Four times a day (QID) | INTRAMUSCULAR | Status: AC | PRN

## 2010-11-10 MED ORDER — KETOROLAC TROMETHAMINE 60 MG/2ML IM SOLN
INTRAMUSCULAR | Status: AC
  Filled 2010-11-10: qty 2

## 2010-11-10 MED ORDER — DIPHENHYDRAMINE HCL 25 MG PO CAPS: 25.0000 mg | ORAL_CAPSULE | Freq: Four times a day (QID) | ORAL | Status: DC | PRN

## 2010-11-10 MED ORDER — ONDANSETRON HCL 4 MG/2ML IJ SOLN: 4.0000 mg | INTRAMUSCULAR | Status: DC | PRN

## 2010-11-10 MED ORDER — MISOPROSTOL 200 MCG PO TABS
ORAL_TABLET | ORAL | Status: AC
  Administered 2010-11-10: 800 ug via RECTAL
  Filled 2010-11-10: qty 1

## 2010-11-10 MED ORDER — NALOXONE HCL 0.4 MG/ML IJ SOLN: 0.4000 mg | INTRAMUSCULAR | Status: DC | PRN

## 2010-11-10 MED ORDER — IBUPROFEN 600 MG PO TABS
600.0000 mg | ORAL_TABLET | Freq: Four times a day (QID) | ORAL | Status: DC
  Administered 2010-11-10 – 2010-11-12 (×9): 600 mg via ORAL
  Filled 2010-11-10 (×2): qty 1

## 2010-11-10 MED ORDER — OXYTOCIN 20 UNITS IN LACTATED RINGERS INFUSION - SIMPLE
125.0000 mL/h | INTRAVENOUS | Status: AC
  Administered 2010-11-10: 125 mL/h via INTRAVENOUS

## 2010-11-10 MED ORDER — TRAMADOL HCL 50 MG PO TABS
50.0000 mg | ORAL_TABLET | Freq: Four times a day (QID) | ORAL | Status: DC | PRN
  Administered 2010-11-10 – 2010-11-12 (×4): 50 mg via ORAL
  Filled 2010-11-10 (×4): qty 1

## 2010-11-10 MED ORDER — LANOLIN HYDROUS EX OINT: 1.0000 "application " | TOPICAL_OINTMENT | CUTANEOUS | Status: DC | PRN

## 2010-11-10 MED ORDER — NALBUPHINE HCL 10 MG/ML IJ SOLN
5.0000 mg | INTRAMUSCULAR | Status: DC | PRN
  Filled 2010-11-10: qty 1

## 2010-11-10 MED ORDER — OXYTOCIN 20 UNITS IN LACTATED RINGERS INFUSION - SIMPLE
INTRAVENOUS | Status: AC
  Filled 2010-11-10: qty 1000

## 2010-11-10 MED ORDER — TETANUS-DIPHTH-ACELL PERTUSSIS 5-2.5-18.5 LF-MCG/0.5 IM SUSP
0.5000 mL | Freq: Once | INTRAMUSCULAR | Status: AC
  Administered 2010-11-11: 0.5 mL via INTRAMUSCULAR

## 2010-11-10 MED ORDER — OXYTOCIN 10 UNIT/ML IJ SOLN
INTRAMUSCULAR | Status: AC
  Filled 2010-11-10: qty 6

## 2010-11-10 MED ORDER — ONDANSETRON HCL 4 MG PO TABS: 4.0000 mg | ORAL_TABLET | ORAL | Status: DC | PRN

## 2010-11-10 MED ORDER — DIPHENHYDRAMINE HCL 25 MG PO CAPS: 25.0000 mg | ORAL_CAPSULE | ORAL | Status: DC | PRN

## 2010-11-10 MED ORDER — MISOPROSTOL 200 MCG PO TABS
ORAL_TABLET | ORAL | Status: AC
  Filled 2010-11-10: qty 4

## 2010-11-10 MED ORDER — IBUPROFEN 600 MG PO TABS
600.0000 mg | ORAL_TABLET | Freq: Four times a day (QID) | ORAL | Status: DC | PRN
  Administered 2010-11-11: 600 mg via ORAL
  Filled 2010-11-10 (×8): qty 1

## 2010-11-10 MED ORDER — MEPERIDINE HCL 25 MG/ML IJ SOLN: 6.2500 mg | INTRAMUSCULAR | Status: DC | PRN

## 2010-11-10 MED ORDER — SCOPOLAMINE 1 MG/3DAYS TD PT72
1.0000 | MEDICATED_PATCH | Freq: Once | TRANSDERMAL | Status: DC
  Administered 2010-11-10: 1.5 mg via TRANSDERMAL

## 2010-11-10 MED ORDER — MENTHOL 3 MG MT LOZG: 1.0000 | LOZENGE | OROMUCOSAL | Status: DC | PRN

## 2010-11-10 MED ORDER — METHYLERGONOVINE MALEATE 0.2 MG/ML IJ SOLN: 0.2000 mg | INTRAMUSCULAR | Status: DC | PRN

## 2010-11-10 MED ORDER — SODIUM CHLORIDE 0.9 % IV SOLN
1.0000 ug/kg/h | INTRAVENOUS | Status: DC | PRN
  Filled 2010-11-10: qty 2.5

## 2010-11-10 MED ORDER — SODIUM CHLORIDE 0.9 % IJ SOLN: 3.0000 mL | INTRAMUSCULAR | Status: DC | PRN

## 2010-11-10 MED ORDER — ZOLPIDEM TARTRATE 5 MG PO TABS: 5.0000 mg | ORAL_TABLET | Freq: Every evening | ORAL | Status: DC | PRN

## 2010-11-10 MED ORDER — WITCH HAZEL-GLYCERIN EX PADS: 1.0000 "application " | MEDICATED_PAD | CUTANEOUS | Status: DC | PRN

## 2010-11-10 MED ORDER — PANTOPRAZOLE SODIUM 40 MG PO TBEC
40.0000 mg | DELAYED_RELEASE_TABLET | Freq: Every day | ORAL | Status: DC
  Administered 2010-11-10 – 2010-11-12 (×4): 40 mg via ORAL
  Filled 2010-11-10 (×5): qty 1

## 2010-11-10 MED ORDER — SENNOSIDES-DOCUSATE SODIUM 8.6-50 MG PO TABS
2.0000 | ORAL_TABLET | Freq: Every day | ORAL | Status: DC
  Administered 2010-11-10 – 2010-11-11 (×2): 2 via ORAL

## 2010-11-10 MED ORDER — ONDANSETRON HCL 4 MG/2ML IJ SOLN: 4.0000 mg | Freq: Three times a day (TID) | INTRAMUSCULAR | Status: DC | PRN

## 2010-11-10 MED ORDER — DIPHENHYDRAMINE HCL 50 MG/ML IJ SOLN: 12.5000 mg | INTRAMUSCULAR | Status: DC | PRN

## 2010-11-10 MED ORDER — KETOROLAC TROMETHAMINE 60 MG/2ML IM SOLN
60.0000 mg | Freq: Once | INTRAMUSCULAR | Status: AC | PRN
  Administered 2010-11-10: 60 mg via INTRAMUSCULAR

## 2010-11-10 MED ORDER — METHYLERGONOVINE MALEATE 0.2 MG PO TABS: 0.2000 mg | ORAL_TABLET | ORAL | Status: DC | PRN

## 2010-11-10 MED ORDER — DIBUCAINE 1 % RE OINT: 1.0000 "application " | TOPICAL_OINTMENT | RECTAL | Status: DC | PRN

## 2010-11-10 MED ORDER — SCOPOLAMINE 1 MG/3DAYS TD PT72
MEDICATED_PATCH | TRANSDERMAL | Status: AC
  Administered 2010-11-10: 1.5 mg via TRANSDERMAL
  Filled 2010-11-10: qty 1

## 2010-11-10 MED ORDER — MISOPROSTOL 200 MCG PO TABS
800.0000 ug | ORAL_TABLET | Freq: Three times a day (TID) | ORAL | Status: DC
  Filled 2010-11-10 (×6): qty 4

## 2010-11-10 MED ORDER — LACTATED RINGERS IV SOLN
INTRAVENOUS | Status: DC
  Administered 2010-11-10: 11:00:00 via INTRAVENOUS

## 2010-11-10 MED ORDER — PRENATAL PLUS 27-1 MG PO TABS
1.0000 | ORAL_TABLET | Freq: Every day | ORAL | Status: DC
  Administered 2010-11-10 – 2010-11-12 (×3): 1 via ORAL
  Filled 2010-11-10 (×3): qty 1

## 2010-11-10 MED ORDER — DIPHENHYDRAMINE HCL 50 MG/ML IJ SOLN: 25.0000 mg | INTRAMUSCULAR | Status: DC | PRN

## 2010-11-10 NOTE — Anesthesia Postprocedure Evaluation (Signed)
  Anesthesia Post-op Note  Patient: Sharon Trujillo  Procedure(s) Performed:  CESAREAN SECTION - Primary Cesarean Section with birth of baby boy @ 2316   Patient is awake, responsive, moving her legs, and has signs of resolution of her numbness. Pain and nausea are reasonably well controlled. Vital signs are stable and clinically acceptable. Oxygen saturation is clinically acceptable. There are no apparent anesthetic complications at this time. Patient is ready for discharge.

## 2010-11-10 NOTE — Progress Notes (Signed)
Encounter addended by: Pat Patrick on: 11/10/2010  8:27 AM<BR>     Documentation filed: Charges VN

## 2010-11-10 NOTE — Progress Notes (Signed)
POD # 1  Subjective: Pt reports feeling drowsy, but well/ Pain controlled with prescription NSAID's including motrin and percocet Tolerating po/ Foley patent No n/v/Flatus neg Activity: up with assistance Bleeding is light Newborn info:  Information for the patient's newborn:  Sevyn, Markham [[id number]]  female  / circ complete, per Dr Billy Coast this am/ Feeding: breast   Objective: VS: Blood pressure 133/84, pulse 94, temperature 98.7 F (37.1 C), temperature source Oral, resp. rate 16  I&O: +400  LABS:  Basename 11/10/10 0525 11/09/10 1647  HGB 10.5* 12.8  HCT 30.3* 37.0     Basename 11/10/10 0525 11/09/10 1647  WBC 14.8* 18.7*  HGB 10.5* 12.8  HCT 30.3* 37.0  PLT 150 209    Blood type: --/--/O POS (08/30 1647) Rubella: Immune (01/13 0000)    Physical Exam:  General: alert, cooperative and no distress CV: Regular rate and rhythm Resp: clear Abdomen: soft, NT; BS hypoactive, faint Incision: Covered with bandage, dry and intact Uterine Fundus: firm, below umbilicus, nontender Perineum: not inspected Lochia: minimal Ext: edema neg and Homans sign is negative, no sign of DVT    A/P: POD # 1/ G1P1001/C/S due to FTP and OP Doing well Continue routine post op orders

## 2010-11-10 NOTE — Addendum Note (Signed)
Addendum  created 11/10/10 0827 by Pat Patrick   Modules edited:Charges VN

## 2010-11-10 NOTE — Transfer of Care (Signed)
  Anesthesia Post-op Note  Patient: Sharon Trujillo  Procedure(s) Performed:  CESAREAN SECTION - Primary Cesarean Section with birth of baby boy @ 2316  Patient Location: PACU  Anesthesia Type: Regional and Epidural  Level of Consciousness: awake, alert  and oriented  Airway and Oxygen Therapy: Patient Spontanous Breathing  Post-op Pain: none  Post-op Assessment: Post-op Vital signs reviewed  Post-op Vital Signs: Reviewed and stable  Complications: No apparent anesthesia complications

## 2010-11-11 NOTE — Progress Notes (Addendum)
Subjective: POD# 2 Information for the patient's newborn:  Jahne, Krukowski [[id number]]  female  / circ done  Reports feeling well, up and ambulating the hallway Feeding: breast Patient reports tolerating PO.  Breast symptoms: none Pain controlled withprescription NSAID's including ibuprophen and narcotic analgesics including tramadol (Ultram) Denies HA/SOB/C/P/N/V/dizziness. Flatus present. She reports vaginal bleeding as normal, without clots.  She is ambulating, urinating without difficult.     Objective:   VS: Blood pressure 112/68, pulse 75, temperature 98.7 F (37.1 C), temperature source Oral, resp. rate 16, height 5\' 11"  (1.803 m), weight 211 lb (95.709 kg), last menstrual period 01/26/2010, SpO2 95.00%, currently breastfeeding.   Intake/Output Summary (Last 24 hours) at 11/11/10 1217 Last data filed at 11/10/10 2000  Gross per 24 hour  Intake   2455 ml  Output   2610 ml  Net   -155 ml        Basename 11/10/10 0525 11/09/10 1647  WBC 14.8* 18.7*  HGB 10.5* 12.8  HCT 30.3* 37.0  PLT 150 209     Blood type: --/--/O POS (08/30 1647)  Rubella: Immune (01/13 0000)     Physical Exam:  General: alert, cooperative and no distress CV: Regular rate and rhythm Resp: clear Abdomen: soft, nontender, normal bowel sounds Incision: clean, dry, intact, no drainage present and staples in place, no erythema, (+) ecchymosis bellow incision line Uterine Fundus: firm, below umbilicus, nontender Lochia: minimal Ext: edema +1 pedal and pretibial and Homans sign is negative, no sign of DVT      Assessment/Plan: 35 y.o.  status post Cesarean section. POD# 2.  s/p Cesarean Delivery.  Indications: failure to progress                Principal Problem:  *Postpartum care following cesarean delivery (8/30) Active Problems:  Gestational hypertension without significant proteinuria in third trimester  Doing well, stable.     Warm PO fluids          Ambulate Routine post-op care May  consider D/C tomorrow  Sarim Rothman 11/11/2010, 12:17 PM

## 2010-11-12 MED ORDER — TRAMADOL HCL 50 MG PO TABS: 50.0000 mg | ORAL_TABLET | Freq: Four times a day (QID) | ORAL | Status: AC | PRN

## 2010-11-12 MED ORDER — IBUPROFEN 600 MG PO TABS: 600.0000 mg | ORAL_TABLET | Freq: Four times a day (QID) | ORAL | Status: AC | PRN

## 2010-11-12 NOTE — Discharge Summary (Signed)
Patient ID: Sharon Trujillo MRN: [id number] DOB/AGE: [birth year] 35 y.o.  Admit date: 11/08/2010 Discharge date:  11/12/2010  Admission Diagnoses: Post dates pregnancy Oligohydramnios Gestational HTN   Discharge Diagnoses:  Principal Problem:  *Postpartum care following cesarean delivery (8/30)   Prenatal history:  Gravida 1 para 0 at 48 6/[redacted] weeks gestation with [facility] of 11/02/2010 Prenatal care at [facility] since [redacted] weeks gestation with Dr. Billy Coast as primary provider.   Prenatal course complicated by size and dates discrepancy with  estimated fetal weight of 9 pounds, oligohydramnios was noted and  postdate status. HSV positive on Valtrex suppression.   Prenatal Labs: ABO, Rh: --/--/O POS (08/30 1647) Antibody: Negative (01/13 0000) Rubella:  immune RPR: NON REACTIVE (08/29 1410)  HBsAg: Negative (01/13 0000)  HIV: Non-reactive (01/13 0000)  GBS: Negative (07/16 0000)  1 hr Glucola : 134   Medical / Surgical History : Past Medical History  Diagnosis Date  . GERD (gastroesophageal reflux disease)   . HSV (herpes simplex virus) anogenital infection   . Post-dates pregnancy 11/09/2010  . Gestational hypertension without significant proteinuria in third trimester 11/09/2010  . Postpartum care following vaginal delivery (11/09/10) 11/10/2010  . Postpartum care following cesarean delivery (8/30) 11/10/2010   Past Surgical History  Procedure Date  . Leep   . Femoral hernia repair    Social History:  reports that she has never smoked. She does not have any smokeless tobacco history on file. She reports that she does not drink alcohol or use illicit drugs.  Allergies: Sulfa antibiotics; Trimethoprim; Codeine; and Penicillins   Current Medications at time of admission:  Prescriptions prior to admission  Medication Sig Dispense Refill  . desoximetasone (TOPICORT) 0.25 % cream Apply topically 2 (two) times daily.        . Prenat w/o A-FeCbGl-DSS-FA-DHA  (CITRANATAL ASSURE) 35-1 & 300 MG tablet Take 1 tablet by mouth daily.        Marland Kitchen DISCONTD: esomeprazole (NEXIUM) 40 MG capsule Take 40 mg by mouth daily before breakfast.        . DISCONTD: valACYclovir (VALTREX) 500 MG tablet Take 500 mg by mouth 2 (two) times daily.            Intrapartum Course: Patient was induce overnight with cervidil for cervical ripening. Elevated BP noted, but repeated  PIH labs wnl. Spontaneous rupture of membranes was noted in early am with clear amniotic fluid. Pitocin was started for augmentation of labor. Patient desired natural labor and used freedom of movement, and hydrotherapy during early labor course for pain relief. Persistent OP presentation and back labor present throughout the morning and by 1600 patient requested epidural. Variable decelerations noted,  relieved with amnioinfusion. No progress in dilation, protracted labor - inadequate pattern despite augmentation - arrested labor with edematous cervix was diagnosed by 2200 and patient was counseled for cesarean section.   Procedures: Cesarean section delivery of female newborn by Dr Billy Coast.  See operative report for further details  Postoperative / postpartum course: uneventful, no evidence of PEC, BP normalized  Physical Exam:  VSS: Blood pressure 136/79, pulse 87, temperature 98.3 F (36.8 C), temperature source Oral, resp. rate 20, height 5\' 11"  (1.803 m), weight 211 lb (95.709 kg), last menstrual period 01/26/2010, SpO2 95.00%, unknown if currently breastfeeding.   LABS:  Lab Results  Component Value Date   HGB 10.5* 11/10/2010  Lab Results  Component Value Date   PLT 150 11/10/2010      I&O: I/O last 3 completed shifts: In: -  Out: 1400 [Urine:1400]      Incision:  approximated with staples / no erythema / (+) ecchymosis lower incision edge/ no drainage Staples: removed prior to discharge  Discharge Instructions:  Postpartum Instructions: per [facility]  Booklet - given to patient  Discharged Condition: good  Diet: Regular diet with adequate water hydration.  Discharge Orders    Future Orders Please Complete By Expires   Diet - low sodium heart healthy      Discharge instructions      Comments:   [facility] instructions booklet      Current Discharge Medication List    START taking these medications   Details  ibuprofen (ADVIL,MOTRIN) 600 MG tablet Take 1 tablet (600 mg total) by mouth every 6 (six) hours as needed for pain. Qty: 60 tablet, Refills: 0    traMADol (ULTRAM) 50 MG tablet Take 1 tablet (50 mg total) by mouth every 6 (six) hours as needed. Qty: 30 tablet, Refills: 0      CONTINUE these medications which have NOT CHANGED   Details  desoximetasone (TOPICORT) 0.25 % cream Apply topically 2 (two) times daily.      Prenat w/o A-FeCbGl-DSS-FA-DHA (CITRANATAL ASSURE) 35-1 & 300 MG tablet Take 1 tablet by mouth daily.        STOP taking these medications     esomeprazole (NEXIUM) 40 MG capsule      valACYclovir (VALTREX) 500 MG tablet        Follow-up Information    Follow up with Lenoard Aden, MD in 6 weeks.   Contact information:   [address] [place] [address] [phone]          Disposition: Final discharge disposition not confirmed  Signed: PAUL,DANIELA 11/12/2010, 10:55 AM

## 2010-11-12 NOTE — Progress Notes (Signed)
Subjective: POD# 3 Information for the patient's newborn:  Tianne, Plott [[id number]]  female  / circ done  Reports feeling tired but well, baby w/ cluster feeds during night Feeding: breast Patient reports tolerating PO.  Breast symptoms: none Pain controlled withibuprofen and tramadol Denies HA/SOB/C/P/N/V/dizziness. Flatus present. She reports vaginal bleeding as normal, without clots.  She is ambulating, urinating without difficult.     Objective:   VS: Blood pressure 136/79, pulse 87, temperature 98.3 F (36.8 C), temperature source Oral, resp. rate 20, height 5\' 11"  (1.803 m), weight 211 lb (95.709 kg), last menstrual period 01/26/2010, SpO2 95.00%,  currently breastfeeding.  No intake or output data in the 24 hours ending 11/12/10 1113      Basename 11/10/10 0525 11/09/10 1647  WBC 14.8* 18.7*  HGB 10.5* 12.8  HCT 30.3* 37.0  PLT 150 209     Blood type: --/--/O POS (08/30 1647)  Rubella: Immune (01/13 0000)     Physical Exam:  General: alert, cooperative and no distress CV: Regular rate and rhythm Resp: clear Abdomen: soft, nontender, normal bowel sounds Incision: clean, dry, intact and staples in plae. No drainage noted, eccymosis on lower incision edge stable, no increase noted since yesterday, no drainage Uterine Fundus: firm, below umbilicus, nontender Lochia: minimal Ext: edema trace pedal and Homans sign is negative, no sign of DVT      Assessment/Plan: 35 y.o.  status post Cesarean section. POD# 3.  s/p Cesarean Delivery.  Indications: failure to progress                Principal Problem:  *Postpartum care following cesarean delivery (8/30) Active Problems:  Gestational hypertension without significant proteinuria in third trimester  Doing well, stable, no evidence PEC, BP normalized  Routine post-op care D/C home Staples removed prior to D/C  PAUL,DANIELA 11/12/2010, 11:13 AM

## 2010-11-14 NOTE — Progress Notes (Signed)
UR Chart review completed.  

## 2010-11-23 ENCOUNTER — Encounter (HOSPITAL_COMMUNITY): Payer: Self-pay | Admitting: Obstetrics and Gynecology

## 2010-12-01 LAB — POCT HEMOGLOBIN-HEMACUE: Hemoglobin: 12.6

## 2011-06-29 ENCOUNTER — Encounter (HOSPITAL_COMMUNITY): Payer: Self-pay | Admitting: Emergency Medicine

## 2011-06-29 ENCOUNTER — Emergency Department (HOSPITAL_COMMUNITY)
Admission: EM | Admit: 2011-06-29 | Discharge: 2011-06-29 | Disposition: A | Discharge status: Home / Self Care | Payer: BC Managed Care – PPO | Attending: Emergency Medicine | Admitting: Emergency Medicine

## 2011-06-29 DIAGNOSIS — K219 Gastro-esophageal reflux disease without esophagitis: Secondary | ICD-10-CM | POA: Insufficient documentation

## 2011-06-29 DIAGNOSIS — W268XXA Contact with other sharp object(s), not elsewhere classified, initial encounter: Secondary | ICD-10-CM | POA: Insufficient documentation

## 2011-06-29 DIAGNOSIS — S0100XA Unspecified open wound of scalp, initial encounter: Secondary | ICD-10-CM | POA: Insufficient documentation

## 2011-06-29 DIAGNOSIS — S0101XA Laceration without foreign body of scalp, initial encounter: Secondary | ICD-10-CM

## 2011-06-29 MED ORDER — TETANUS-DIPHTH-ACELL PERTUSSIS 5-2.5-18.5 LF-MCG/0.5 IM SUSP
0.5000 mL | Freq: Once | INTRAMUSCULAR | Status: DC
  Filled 2011-06-29: qty 0.5

## 2011-06-29 NOTE — ED Notes (Signed)
EDP into room with RN, deep abrasion noted to R frontal area above hairline, no active bleeding.

## 2011-06-29 NOTE — ED Notes (Signed)
PT. PRESENTS WITH SUPERFICIAL LACERATION AT RIGHT SCALP , HIT HEAD AGAINST TIN ROOF THIS EVENING , NO LOC , DENIES PAIN OR DISCOMFORT.

## 2011-06-29 NOTE — ED Notes (Signed)
Tin roof fell and cut head, deep abrasion noted, unsure of Td, (denies: LOC, visual changes, neck pain, HA, vomiting or other sx), EDP cleaned with betadine & saline.

## 2011-06-29 NOTE — ED Provider Notes (Signed)
History     CSN: [id number]  Arrival date & time 06/29/11  2202   First MD Initiated Contact with Patient 06/29/11 2212      Chief Complaint  Patient presents with  . Laceration    (Consider location/radiation/quality/duration/timing/severity/associated sxs/prior treatment) Patient is a 36 y.o. female presenting with skin laceration. The history is provided by the patient.  Laceration  The incident occurred less than 1 hour ago. The laceration is located on the scalp. The laceration is 2 cm in size. The laceration mechanism was a a metal edge. The pain is at a severity of 0/10. The patient is experiencing no pain. She reports no foreign bodies present. Tetanus status: ~67yrs.    Past Medical History  Diagnosis Date  . GERD (gastroesophageal reflux disease)   . HSV (herpes simplex virus) anogenital infection   . Post-dates pregnancy 11/09/2010  . Gestational hypertension without significant proteinuria in third trimester 11/09/2010  . Postpartum care following vaginal delivery (11/09/10) 11/10/2010  . Postpartum care following cesarean delivery (8/30) 11/10/2010    Past Surgical History  Procedure Date  . Leep   . Femoral hernia repair   . Cesarean section 11/09/2010    Procedure: CESAREAN SECTION;  Surgeon: Lenoard Aden, MD;  Location: [facility];  Service: Gynecology;  Laterality: N/A;  Primary Cesarean Section with birth of baby boy @ 16    Family History  Problem Relation Age of Onset  . Cancer Maternal Grandmother   . Stroke Maternal Grandmother   . Cancer Paternal Grandfather   . Arthritis Mother   . Hyperlipidemia Mother   . Hypertension Maternal Uncle   . Mental retardation Paternal Aunt   . Heart disease Paternal Grandmother     History  Substance Use Topics  . Smoking status: Never Smoker   . Smokeless tobacco: Not on file  . Alcohol Use: No    OB History    Grav Para Term Preterm Abortions TAB SAB Ect Mult Living   1 1 1              Review of  Systems  Constitutional: Negative for fatigue.  HENT: Negative for neck pain.   Respiratory: Negative for shortness of breath.   Cardiovascular: Negative for chest pain.  Gastrointestinal: Negative for nausea, vomiting and abdominal pain.  Skin: Positive for wound. Negative for rash.  Neurological: Negative for dizziness, numbness and headaches.  All other systems reviewed and are negative.    Allergies  Sulfa antibiotics; Trimethoprim; Codeine; and Penicillins  Home Medications   Current Outpatient Rx  Name Route Sig Dispense Refill  . DESOXIMETASONE 0.25 % EX CREA Topical Apply topically 2 (two) times daily.      Marland Kitchen CITRANATAL ASSURE 35-1 & 300 MG PO MISC Oral Take 1 tablet by mouth daily.        BP 124/75  Pulse 77  Temp(Src) 98.4 F (36.9 C) (Oral)  Resp 18  SpO2 98%  Physical Exam  Nursing note and vitals reviewed. Constitutional: She is oriented to person, place, and time. She appears well-developed and well-nourished.  HENT:  Head: Normocephalic and atraumatic.    Eyes: EOM are normal.  Neck: Normal range of motion.  Cardiovascular: Normal rate, regular rhythm and normal heart sounds.   Pulmonary/Chest: Effort normal and breath sounds normal. No respiratory distress.  Abdominal: Soft. There is no tenderness.  Musculoskeletal: Normal range of motion.  Neurological: She is alert and oriented to person, place, and time. She has normal strength. No cranial  nerve deficit or sensory deficit. GCS eye subscore is 4. GCS verbal subscore is 5. GCS motor subscore is 6.  Skin: Skin is warm and dry.  Psychiatric: She has a normal mood and affect.    ED Course  Procedures (including critical care time)  Labs Reviewed - No data to display No results found.   1. Scalp laceration       MDM  Patient presents after a superficial injury to her right side of her head. Patient reports that she stood up and hit her head on the side of a tin roof. She denies loss of  consciousness. She denies headache. She denies vomiting. Mental status normal here without neurologic deficit. Patient has a very superficial laceration to the right parietal scalp. There is no significant depth to this wound. Do not feel as the patient would benefit from primary closure. Wound irrigated with normal saline and cleaned with Betadine. Initially planned tetanus update but patient didn't recollected that she had a booster with her last pregnancy. No indication for head CT.        Donnamarie Poag, MD 06/29/11 2329

## 2011-06-29 NOTE — ED Notes (Signed)
Bacitracin applied

## 2011-06-30 NOTE — ED Provider Notes (Signed)
I saw and evaluated the patient, reviewed the resident's note and I agree with the findings and plan.  Jerilynn Feldmeier T Hope Brandenburger, MD 06/30/11 1542 

## 2012-01-15 ENCOUNTER — Other Ambulatory Visit: Payer: Self-pay | Admitting: Obstetrics and Gynecology

## 2012-01-15 NOTE — H&P (Signed)
NAMESHANTERA, MONTS NO.:  [id number]  MEDICAL RECORD NO.:  [id number]  LOCATION:  [facility]                         FACILITY:  [facility]  PHYSICIAN:  Lenoard Aden, M.D.DATE OF BIRTH:  06-19-1975  DATE OF ADMISSION:  01/15/2012 DATE OF DISCHARGE:                             HISTORY & PHYSICAL   CHIEF COMPLAINT:  AB.  A 36 year old white female, G2, P1, history of failed medical termination of a missed AB, who presents now for surgical intervention.  ALLERGIES:  CODEINE, SULFONAMIDES, and penicillin.  She is a nonsmoker, nondrinker.  She denies domestic or physical violence.  FAMILY HISTORY:  Breast cancer, cerebrovascular disease, and Alzheimer's disease, previous history of miscarriage, and previous C-section.  SURGICAL HISTORY:  Also remarkable for LEEP and femoral hernia repair.  MEDICATIONS:  Zofran p.r.n., prenatal vitamins.  PHYSICAL EXAMINATION:  GENERAL:  She is a well-developed, well- nourished, white female, in no acute distress. HEENT:  Normal. NECK:  Supple.  Full range of motion. LUNGS:  Clear. HEART:  Regular rhythm. ABDOMEN:  Soft, nontender. __________ EXTREMITIES:  There are no cords. NEUROLOGIC:  Nonfocal. SKIN:  Intact.  IMPRESSION:  Missed abortion at 6-8 weeks with failed medical treatment.  PLAN:  Proceed with suction D and E.  Risks of anesthesia, infection, bleeding, injury to abdominal organs, need for repair was discussed. Delayed versus immediate complications to include bowel and bladder injury noted.  The patient acknowledges and wishes to proceed.     Lenoard Aden, M.D.     RJT/MEDQ  D:  01/15/2012  T:  01/15/2012  Job:  [id number]

## 2012-01-16 ENCOUNTER — Ambulatory Visit (HOSPITAL_COMMUNITY)
Admission: RE | Admit: 2012-01-16 | Discharge: 2012-01-16 | Disposition: A | Discharge status: Home / Self Care | Payer: BC Managed Care – PPO | Source: Ambulatory Visit | Attending: Obstetrics and Gynecology | Admitting: Obstetrics and Gynecology

## 2012-01-16 ENCOUNTER — Ambulatory Visit (HOSPITAL_COMMUNITY): Payer: BC Managed Care – PPO | Admitting: Anesthesiology

## 2012-01-16 ENCOUNTER — Encounter (HOSPITAL_COMMUNITY)
Admission: RE | Disposition: A | Discharge status: Home / Self Care | Payer: Self-pay | Source: Ambulatory Visit | Attending: Obstetrics and Gynecology

## 2012-01-16 ENCOUNTER — Encounter (HOSPITAL_COMMUNITY): Payer: Self-pay | Admitting: Anesthesiology

## 2012-01-16 ENCOUNTER — Encounter (HOSPITAL_COMMUNITY): Payer: Self-pay | Admitting: *Deleted

## 2012-01-16 DIAGNOSIS — O021 Missed abortion: Secondary | ICD-10-CM | POA: Insufficient documentation

## 2012-01-16 HISTORY — PX: DILATION AND EVACUATION: SHX1459

## 2012-01-16 LAB — CBC
Platelets: 188 10*3/uL (ref 150–400)
RBC: 4.01 MIL/uL (ref 3.87–5.11)
WBC: 3.7 10*3/uL — ABNORMAL LOW (ref 4.0–10.5)

## 2012-01-16 SURGERY — DILATION AND EVACUATION, UTERUS
Anesthesia: Monitor Anesthesia Care | Site: Vagina | Wound class: Clean Contaminated

## 2012-01-16 MED ORDER — MEPERIDINE HCL 25 MG/ML IJ SOLN: 6.2500 mg | INTRAMUSCULAR | Status: DC | PRN

## 2012-01-16 MED ORDER — PROMETHAZINE HCL 25 MG/ML IJ SOLN: 6.2500 mg | INTRAMUSCULAR | Status: DC | PRN

## 2012-01-16 MED ORDER — LACTATED RINGERS IV SOLN
INTRAVENOUS | Status: DC
  Administered 2012-01-16 (×2): via INTRAVENOUS

## 2012-01-16 MED ORDER — LIDOCAINE HCL (CARDIAC) 20 MG/ML IV SOLN
INTRAVENOUS | Status: AC
  Filled 2012-01-16: qty 5

## 2012-01-16 MED ORDER — MIDAZOLAM HCL 2 MG/2ML IJ SOLN: 0.5000 mg | Freq: Once | INTRAMUSCULAR | Status: DC | PRN

## 2012-01-16 MED ORDER — BUPIVACAINE HCL (PF) 0.25 % IJ SOLN
INTRAMUSCULAR | Status: AC
  Filled 2012-01-16: qty 30

## 2012-01-16 MED ORDER — ONDANSETRON HCL 4 MG/2ML IJ SOLN
INTRAMUSCULAR | Status: DC | PRN
  Administered 2012-01-16: 4 mg via INTRAVENOUS

## 2012-01-16 MED ORDER — KETOROLAC TROMETHAMINE 30 MG/ML IJ SOLN
INTRAMUSCULAR | Status: AC
  Filled 2012-01-16: qty 1

## 2012-01-16 MED ORDER — KETOROLAC TROMETHAMINE 30 MG/ML IJ SOLN
15.0000 mg | Freq: Once | INTRAMUSCULAR | Status: AC | PRN
  Administered 2012-01-16: 60 mg via INTRAVENOUS

## 2012-01-16 MED ORDER — PROPOFOL 10 MG/ML IV EMUL
INTRAVENOUS | Status: AC
Start: 2012-01-16 — End: 2012-01-16
  Filled 2012-01-16: qty 20

## 2012-01-16 MED ORDER — ACETAMINOPHEN 10 MG/ML IV SOLN
1000.0000 mg | Freq: Four times a day (QID) | INTRAVENOUS | Status: DC
  Administered 2012-01-16 (×2): 1000 mg via INTRAVENOUS

## 2012-01-16 MED ORDER — PROPOFOL INFUSION 10 MG/ML OPTIME
INTRAVENOUS | Status: DC | PRN
  Administered 2012-01-16 (×3): 30 mL via INTRAVENOUS
  Administered 2012-01-16 (×3): 50 mL via INTRAVENOUS

## 2012-01-16 MED ORDER — DEXAMETHASONE SODIUM PHOSPHATE 10 MG/ML IJ SOLN
INTRAMUSCULAR | Status: AC
Start: 2012-01-16 — End: 2012-01-16
  Filled 2012-01-16: qty 1

## 2012-01-16 MED ORDER — ACETAMINOPHEN 10 MG/ML IV SOLN
INTRAVENOUS | Status: AC
  Administered 2012-01-16: 1000 mg via INTRAVENOUS
  Filled 2012-01-16: qty 100

## 2012-01-16 MED ORDER — ONDANSETRON HCL 4 MG/2ML IJ SOLN
INTRAMUSCULAR | Status: AC
  Filled 2012-01-16: qty 2

## 2012-01-16 MED ORDER — BUPIVACAINE HCL (PF) 0.25 % IJ SOLN
INTRAMUSCULAR | Status: DC | PRN
  Administered 2012-01-16: 20 mL

## 2012-01-16 MED ORDER — LIDOCAINE HCL (CARDIAC) 20 MG/ML IV SOLN
INTRAVENOUS | Status: DC | PRN
  Administered 2012-01-16 (×2): 20 mg via INTRAVENOUS

## 2012-01-16 SURGICAL SUPPLY — 21 items
CATH ROBINSON RED A/P 16FR (CATHETERS) ×2 IMPLANT
CLOTH BEACON ORANGE TIMEOUT ST (SAFETY) ×2 IMPLANT
DECANTER SPIKE VIAL GLASS SM (MISCELLANEOUS) ×2 IMPLANT
GLOVE BIO SURGEON STRL SZ7.5 (GLOVE) ×4 IMPLANT
GOWN PREVENTION PLUS LG XLONG (DISPOSABLE) ×2 IMPLANT
GOWN STRL REIN XL XLG (GOWN DISPOSABLE) ×2 IMPLANT
KIT BERKELEY 1ST TRIMESTER 3/8 (MISCELLANEOUS) ×2 IMPLANT
NEEDLE SPNL 22GX3.5 QUINCKE BK (NEEDLE) ×2 IMPLANT
NS IRRIG 1000ML POUR BTL (IV SOLUTION) ×2 IMPLANT
PACK VAGINAL MINOR WOMEN LF (CUSTOM PROCEDURE TRAY) ×2 IMPLANT
PAD OB MATERNITY 4.3X12.25 (PERSONAL CARE ITEMS) ×2 IMPLANT
PAD PREP 24X48 CUFFED NSTRL (MISCELLANEOUS) ×2 IMPLANT
SET BERKELEY SUCTION TUBING (SUCTIONS) ×2 IMPLANT
SYR CONTROL 10ML LL (SYRINGE) ×2 IMPLANT
TOWEL OR 17X24 6PK STRL BLUE (TOWEL DISPOSABLE) ×4 IMPLANT
VACURETTE 10 RIGID CVD (CANNULA) IMPLANT
VACURETTE 7MM CVD STRL WRAP (CANNULA) IMPLANT
VACURETTE 7MM STRAIGHT (CANNULA) IMPLANT
VACURETTE 8 RIGID CVD (CANNULA) IMPLANT
VACURETTE 9 RIGID CVD (CANNULA) IMPLANT
VACURRETTE 7MM STRAIGHT (CANNULA)

## 2012-01-16 NOTE — Anesthesia Postprocedure Evaluation (Signed)
Anesthesia Post Note  Patient: Sharon Trujillo  Procedure(s) Performed: Procedure(s) (LRB): DILATATION AND EVACUATION (N/A)  Anesthesia type: MAC  Patient location: PACU  Post pain: Pain level controlled  Post assessment: Post-op Vital signs reviewed  Last Vitals:  Filed Vitals:   01/16/12 1103  BP: 110/74  Pulse: 69  Temp: 36.7 C  Resp: 18    Post vital signs: Reviewed  Level of consciousness: sedated  Complications: No apparent anesthesia complications

## 2012-01-16 NOTE — Transfer of Care (Signed)
Immediate Anesthesia Transfer of Care Note  Patient: Sharon Trujillo  Procedure(s) Performed: Procedure(s) (LRB) with comments: DILATATION AND EVACUATION (N/A)  Patient Location: PACU  Anesthesia Type:MAC  Level of Consciousness: awake, alert , oriented and patient cooperative  Airway & Oxygen Therapy: Patient Spontanous Breathing and Patient connected to nasal cannula oxygen  Post-op Assessment: Report given to PACU RN and Post -op Vital signs reviewed and stable  Post vital signs: Reviewed and stable  Complications: No apparent anesthesia complications

## 2012-01-16 NOTE — Op Note (Signed)
01/16/2012  10:54 AM  PATIENT:  Sharon Trujillo  36 y.o. female  PRE-OPERATIVE DIAGNOSIS:  Missed Abortion  POST-OPERATIVE DIAGNOSIS:  Missed Abortion  PROCEDURE:  Procedure(s): DILATATION AND EVACUATION  SURGEON:  Surgeon(s): Lenoard Aden, MD  ASSISTANTS: none   ANESTHESIA:   local and IV sedation  ESTIMATED BLOOD LOSS: * No blood loss amount entered *   DRAINS: none   LOCAL MEDICATIONS USED:  MARCAINE     SPECIMEN:  Source of Specimen:  poc  DISPOSITION OF SPECIMEN:  PATHOLOGY  COUNTS:  YES  DICTATION #: [id number]  PLAN OF CARE: dc home  PATIENT DISPOSITION:  PACU - hemodynamically stable.

## 2012-01-16 NOTE — Anesthesia Preprocedure Evaluation (Signed)
Anesthesia Evaluation  Patient identified by MRN, date of birth, ID band Patient awake    Reviewed: Allergy & Precautions, H&P , Patient's Chart, lab work & pertinent test results, reviewed documented beta blocker date and time   History of Anesthesia Complications Negative for: history of anesthetic complications  Airway Mallampati: I TM Distance: >3 FB Neck ROM: full    Dental No notable dental hx.    Pulmonary neg pulmonary ROS,  breath sounds clear to auscultation  Pulmonary exam normal       Cardiovascular Exercise Tolerance: Good hypertension, negative cardio ROS  Rhythm:regular Rate:Normal     Neuro/Psych negative neurological ROS  negative psych ROS   GI/Hepatic negative GI ROS, Neg liver ROS, GERD-  Controlled,  Endo/Other  negative endocrine ROS  Renal/GU negative Renal ROS     Musculoskeletal   Abdominal   Peds  Hematology negative hematology ROS (+)   Anesthesia Other Findings GERD (gastroesophageal reflux disease)     HSV (herpes simplex virus) anogenital infection        Post-dates pregnancy 11/09/2010   Gestational hypertension without significant proteinuria in third trimester 11/09/2010      Postpartum care following vaginal delivery (11/09/10) 11/10/2010   Postpartum care following cesarean delivery (8/30) 8    Reproductive/Obstetrics negative OB ROS                           Anesthesia Physical Anesthesia Plan  ASA: I  Anesthesia Plan: MAC   Post-op Pain Management:    Induction:   Airway Management Planned:   Additional Equipment:   Intra-op Plan:   Post-operative Plan:   Informed Consent: I have reviewed the patients History and Physical, chart, labs and discussed the procedure including the risks, benefits and alternatives for the proposed anesthesia with the patient or authorized representative who has indicated his/her understanding and acceptance.    Dental Advisory Given  Plan Discussed with: CRNA, Surgeon and Anesthesiologist  Anesthesia Plan Comments:         Anesthesia Quick Evaluation

## 2012-01-16 NOTE — Progress Notes (Signed)
Patient ID: Sharon Trujillo, female   DOB: 04/08/1975, 36 y.o.   MRN: [id number] Patient seen and examined. Consent witnessed and signed. No changes noted. Update completed.

## 2012-01-17 ENCOUNTER — Encounter (HOSPITAL_COMMUNITY): Payer: Self-pay | Admitting: Obstetrics and Gynecology

## 2012-01-17 NOTE — Op Note (Signed)
Sharon Trujillo, Sharon Trujillo NO.:  [id number]  MEDICAL RECORD NO.:  [id number]  LOCATION:  [facility]                          FACILITY:  [facility]  PHYSICIAN:  Lenoard Aden, M.D.DATE OF BIRTH:  04-06-1975  DATE OF PROCEDURE:  01/16/2012 DATE OF DISCHARGE:  01/16/2012                              OPERATIVE REPORT   DESCRIPTION OF PROCEDURE:  After being apprised of the risks of anesthesia, infection, bleeding, injury to abdominal organs, possible need for repair, delayed versus immediate complications secondary to uterine perforation, possible need for repair, the patient was brought to the operating room, was administered IV sedation without difficulty. Prepped and draped in usual sterile fashion, catheterized until the bladder was empty.  Exam under anesthesia reveals a 6-8 week size anteflexed uterus and no adnexal masses.  Dilute Marcaine solution was placed and a paracervical block 20 mL total.  Cervix easily dilated up to a #24 or 25 Pratt dilator, 8 mm suction curette placed.  Products of conception aspirated without difficulty.  Repeat suction and curettage in a 4-quadrant method bluntly reveals cavity to be empty.  Good hemostasis noted.  All instruments removed.  The patient tolerated the procedure well.  Tissue was sent to Pathology for confirmation.  The patient was transferred to recovery in good condition.     Lenoard Aden, M.D.     RJT/MEDQ  D:  01/16/2012  T:  01/17/2012  Job:  [id number]

## 2012-06-23 LAB — OB RESULTS CONSOLE ANTIBODY SCREEN: Antibody Screen: NEGATIVE

## 2012-06-23 LAB — OB RESULTS CONSOLE ABO/RH: RH Type: POSITIVE

## 2012-06-23 LAB — OB RESULTS CONSOLE HIV ANTIBODY (ROUTINE TESTING): HIV: NONREACTIVE

## 2012-06-23 LAB — OB RESULTS CONSOLE HEPATITIS B SURFACE ANTIGEN: Hepatitis B Surface Ag: NEGATIVE

## 2012-06-23 LAB — OB RESULTS CONSOLE RUBELLA ANTIBODY, IGM: Rubella: IMMUNE

## 2012-09-12 ENCOUNTER — Emergency Department (HOSPITAL_BASED_OUTPATIENT_CLINIC_OR_DEPARTMENT_OTHER)
Admission: EM | Admit: 2012-09-12 | Discharge: 2012-09-12 | Disposition: A | Discharge status: Home / Self Care | Payer: BC Managed Care – PPO | Attending: Emergency Medicine | Admitting: Emergency Medicine

## 2012-09-12 ENCOUNTER — Encounter (HOSPITAL_BASED_OUTPATIENT_CLINIC_OR_DEPARTMENT_OTHER): Payer: Self-pay | Admitting: *Deleted

## 2012-09-12 DIAGNOSIS — S91309A Unspecified open wound, unspecified foot, initial encounter: Secondary | ICD-10-CM | POA: Insufficient documentation

## 2012-09-12 DIAGNOSIS — Y9389 Activity, other specified: Secondary | ICD-10-CM | POA: Insufficient documentation

## 2012-09-12 DIAGNOSIS — Z880 Allergy status to penicillin: Secondary | ICD-10-CM | POA: Insufficient documentation

## 2012-09-12 DIAGNOSIS — Z8719 Personal history of other diseases of the digestive system: Secondary | ICD-10-CM | POA: Insufficient documentation

## 2012-09-12 DIAGNOSIS — Z8619 Personal history of other infectious and parasitic diseases: Secondary | ICD-10-CM | POA: Insufficient documentation

## 2012-09-12 DIAGNOSIS — Z872 Personal history of diseases of the skin and subcutaneous tissue: Secondary | ICD-10-CM | POA: Insufficient documentation

## 2012-09-12 DIAGNOSIS — IMO0002 Reserved for concepts with insufficient information to code with codable children: Secondary | ICD-10-CM

## 2012-09-12 DIAGNOSIS — W2209XA Striking against other stationary object, initial encounter: Secondary | ICD-10-CM | POA: Insufficient documentation

## 2012-09-12 DIAGNOSIS — O9989 Other specified diseases and conditions complicating pregnancy, childbirth and the puerperium: Secondary | ICD-10-CM | POA: Insufficient documentation

## 2012-09-12 DIAGNOSIS — Y929 Unspecified place or not applicable: Secondary | ICD-10-CM | POA: Insufficient documentation

## 2012-09-12 HISTORY — DX: Psoriasis, unspecified: L40.9

## 2012-09-12 MED ORDER — CEPHALEXIN 500 MG PO CAPS: 500.0000 mg | ORAL_CAPSULE | Freq: Four times a day (QID) | ORAL | Status: DC

## 2012-09-12 MED ORDER — ACETAMINOPHEN 325 MG PO TABS
650.0000 mg | ORAL_TABLET | Freq: Once | ORAL | Status: AC
  Administered 2012-09-12: 650 mg via ORAL
  Filled 2012-09-12: qty 2

## 2012-09-12 MED ORDER — BUPIVACAINE-EPINEPHRINE (PF) 0.5% -1:200000 IJ SOLN
INTRAMUSCULAR | Status: AC
  Filled 2012-09-12: qty 1.8

## 2012-09-12 NOTE — ED Provider Notes (Signed)
History    CSN: [id number] Arrival date & time 09/12/12  1138  First MD Initiated Contact with Patient 09/12/12 1245     Chief Complaint  Patient presents with  . Extremity Laceration   (Consider location/radiation/quality/duration/timing/severity/associated sxs/prior Treatment) HPI Comments: Patient presents with a laceration to her heel. She states that a metal door closed on her right heel sustaining a laceration. She denies any numbness to her foot. She is able dorsiflex the foot without problem. She has some constant mild pain to the area. She is currently pregnant.  Past Medical History  Diagnosis Date  . GERD (gastroesophageal reflux disease)   . HSV (herpes simplex virus) anogenital infection   . Post-dates pregnancy 11/09/2010  . Gestational hypertension without significant proteinuria in third trimester 11/09/2010  . Postpartum care following vaginal delivery (11/09/10) 11/10/2010  . Postpartum care following cesarean delivery (8/30) 11/10/2010  . Pregnancy   . Psoriasis    Past Surgical History  Procedure Laterality Date  . Leep    . Femoral hernia repair    . Cesarean section  11/09/2010    Procedure: CESAREAN SECTION;  Surgeon: Lenoard Aden, MD;  Location: [facility];  Service: Gynecology;  Laterality: N/A;  Primary Cesarean Section with birth of baby boy @ 2316  . Dilation and evacuation  01/16/2012    Procedure: DILATATION AND EVACUATION;  Surgeon: Lenoard Aden, MD;  Location: [facility];  Service: Gynecology;  Laterality: N/A;   Family History  Problem Relation Age of Onset  . Cancer Maternal Grandmother   . Stroke Maternal Grandmother   . Cancer Paternal Grandfather   . Arthritis Mother   . Hyperlipidemia Mother   . Hypertension Maternal Uncle   . Mental retardation Paternal Aunt   . Heart disease Paternal Grandmother    History  Substance Use Topics  . Smoking status: Never Smoker   . Smokeless tobacco: Not on file  . Alcohol Use: No   OB History   Grav  Para Term Preterm Abortions TAB SAB Ect Mult Living   2 1 1             Review of Systems  Constitutional: Negative for fever.  HENT: Negative for neck pain.   Gastrointestinal: Negative for nausea and vomiting.  Musculoskeletal: Negative for back pain and arthralgias.  Skin: Positive for wound.  Neurological: Negative for weakness, numbness and headaches.    Allergies  Sulfa antibiotics; Trimethoprim; Codeine; and Penicillins  Home Medications   Current Outpatient Rx  Name  Route  Sig  Dispense  Refill  . desoximetasone (TOPICORT) 0.25 % cream   Topical   Apply topically 2 (two) times daily.           . Prenat w/o A-FeCbGl-DSS-FA-DHA (CITRANATAL ASSURE) 35-1 & 300 MG tablet   Oral   Take 1 tablet by mouth daily.            BP 112/77  Pulse 73  Temp(Src) 98.5 F (36.9 C) (Oral)  Resp 20  SpO2 100% Physical Exam  Constitutional: She is oriented to person, place, and time. She appears well-developed and well-nourished.  HENT:  Head: Normocephalic and atraumatic.  Neck: Normal range of motion. Neck supple.  Cardiovascular: Normal rate.   Pulmonary/Chest: Effort normal.  Musculoskeletal: She exhibits no edema and no tenderness.  Neurological: She is alert and oriented to person, place, and time.  Skin: Skin is warm and dry.  Patient has a 2 and half centimeter laceration across the heel the  right foot. The Achilles tendon appears intact. She is able to dorsiflex her foot without problem. She has no underlying bony tenderness to the calcaneus. She has normal sensation distally. Capillary refill is less than 2 distally.  Psychiatric: She has a normal mood and affect.    ED Course  Procedures (including critical care time) Labs Reviewed - No data to display No results found. 1. Laceration     MDM  The wound is repaired by Trisha Mangle. Patient was given wound care instructions and advised to followup with her primary care physician for suture removal. Her tetanus  shot is up-to-date. She was advised to return sooner for any worsening symptoms or signs of infection.  Rolan Bucco, MD 09/12/12 1331

## 2012-09-12 NOTE — ED Notes (Signed)
Suture cart is at the bedside set up and ready for the doctor to use. 

## 2012-09-12 NOTE — ED Provider Notes (Signed)
Medical screening examination/treatment/procedure(s) were performed by non-physician practitioner and as supervising physician I was immediately available for consultation/collaboration.   Yuvaan Olander, MD 09/12/12 1429 

## 2012-09-12 NOTE — ED Provider Notes (Signed)
   History    CSN: [id number] Arrival date & time 09/12/12  1138  First MD Initiated Contact with Patient 09/12/12 1245     Chief Complaint  Patient presents with  . Extremity Laceration   (Consider location/radiation/quality/duration/timing/severity/associated sxs/prior Treatment) HPI Past Medical History  Diagnosis Date  . GERD (gastroesophageal reflux disease)   . HSV (herpes simplex virus) anogenital infection   . Post-dates pregnancy 11/09/2010  . Gestational hypertension without significant proteinuria in third trimester 11/09/2010  . Postpartum care following vaginal delivery (11/09/10) 11/10/2010  . Postpartum care following cesarean delivery (8/30) 11/10/2010  . Pregnancy   . Psoriasis    Past Surgical History  Procedure Laterality Date  . Leep    . Femoral hernia repair    . Cesarean section  11/09/2010    Procedure: CESAREAN SECTION;  Surgeon: Lenoard Aden, MD;  Location: [facility];  Service: Gynecology;  Laterality: N/A;  Primary Cesarean Section with birth of baby boy @ 2316  . Dilation and evacuation  01/16/2012    Procedure: DILATATION AND EVACUATION;  Surgeon: Lenoard Aden, MD;  Location: [facility];  Service: Gynecology;  Laterality: N/A;   Family History  Problem Relation Age of Onset  . Cancer Maternal Grandmother   . Stroke Maternal Grandmother   . Cancer Paternal Grandfather   . Arthritis Mother   . Hyperlipidemia Mother   . Hypertension Maternal Uncle   . Mental retardation Paternal Aunt   . Heart disease Paternal Grandmother    History  Substance Use Topics  . Smoking status: Never Smoker   . Smokeless tobacco: Not on file  . Alcohol Use: No   OB History   Grav Para Term Preterm Abortions TAB SAB Ect Mult Living   2 1 1             Review of Systems  Allergies  Sulfa antibiotics; Trimethoprim; Codeine; and Penicillins  Home Medications   Current Outpatient Rx  Name  Route  Sig  Dispense  Refill  . desoximetasone (TOPICORT) 0.25 % cream  Topical   Apply topically 2 (two) times daily.           . Prenat w/o A-FeCbGl-DSS-FA-DHA (CITRANATAL ASSURE) 35-1 & 300 MG tablet   Oral   Take 1 tablet by mouth daily.            BP 112/77  Pulse 73  Temp(Src) 98.5 F (36.9 C) (Oral)  Resp 20  SpO2 100% Physical Exam  ED Course  LACERATION REPAIR Date/Time: 09/12/2012 1:45 PM Performed by: Elson Areas Authorized by: Elson Areas Consent: Verbal consent obtained. Risks and benefits: risks, benefits and alternatives were discussed Consent given by: patient Patient understanding: patient states understanding of the procedure being performed Patient identity confirmed: verbally with patient Laceration length: 3 cm Foreign bodies: metal Tendon involvement: none Nerve involvement: none Anesthesia: local infiltration Preparation: Patient was prepped and draped in the usual sterile fashion. Irrigation solution: saline Irrigation method: syringe Amount of cleaning: standard Debridement: none Skin closure: 5-0 Prolene and 4-0 Prolene Number of sutures: 8 Technique: simple Approximation: loose Approximation difficulty: simple Dressing: 4x4 sterile gauze   (including critical care time) Labs Reviewed - No data to display No results found. 1. Laceration     MDM  Keflex, wound care  Elson Areas, PA-C 09/12/12 1347

## 2012-09-12 NOTE — ED Notes (Addendum)
Metal door shut on her right heel. Laceration noted. Bleeding controlled. 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

## 2013-01-14 ENCOUNTER — Encounter (HOSPITAL_COMMUNITY): Payer: Self-pay | Admitting: Pharmacist

## 2013-01-19 ENCOUNTER — Other Ambulatory Visit: Payer: Self-pay | Admitting: Obstetrics and Gynecology

## 2013-01-19 ENCOUNTER — Encounter (HOSPITAL_COMMUNITY): Payer: Self-pay

## 2013-01-21 ENCOUNTER — Encounter (HOSPITAL_COMMUNITY): Payer: Self-pay

## 2013-01-21 ENCOUNTER — Encounter (HOSPITAL_COMMUNITY)
Admission: RE | Admit: 2013-01-21 | Discharge: 2013-01-21 | Disposition: A | Discharge status: Home / Self Care | Payer: BC Managed Care – PPO | Source: Ambulatory Visit | Attending: Obstetrics and Gynecology | Admitting: Obstetrics and Gynecology

## 2013-01-21 HISTORY — DX: Lactose intolerance, unspecified: E73.9

## 2013-01-21 LAB — RPR: RPR Ser Ql: NONREACTIVE

## 2013-01-21 LAB — CBC
HCT: 38 % (ref 36.0–46.0)
Hemoglobin: 13.1 g/dL (ref 12.0–15.0)
MCH: 32.2 pg (ref 26.0–34.0)
MCV: 93.4 fL (ref 78.0–100.0)
RBC: 4.07 MIL/uL (ref 3.87–5.11)
RDW: 13.5 % (ref 11.5–15.5)

## 2013-01-21 LAB — TYPE AND SCREEN: Antibody Screen: NEGATIVE

## 2013-01-21 NOTE — Patient Instructions (Signed)
Your procedure is scheduled on:01/23/13  Enter through the Main Entrance at :0845 am Pick up desk phone and dial [phone] and inform us of your arrival.  Please call [phone] if you have any problems the morning of surgery.  Remember: Do not eat food after midnight:Thursday Water ok until 6am on Fri    You may brush your teeth the morning of surgery.   DO NOT wear jewelry, eye make-up, lipstick,body lotion, or dark fingernail polish.  (Polished toes are ok) You may wear deodorant.  If you are to be admitted after surgery, leave suitcase in car until your room has been assigned. Patients discharged on the day of surgery will not be allowed to drive home. Wear loose fitting, comfortable clothes for your ride home.

## 2013-01-23 ENCOUNTER — Encounter (HOSPITAL_COMMUNITY)
Admission: AD | Disposition: A | Discharge status: Home / Self Care | Payer: Self-pay | Source: Ambulatory Visit | Attending: Obstetrics and Gynecology

## 2013-01-23 ENCOUNTER — Encounter (HOSPITAL_COMMUNITY): Payer: BC Managed Care – PPO | Admitting: Anesthesiology

## 2013-01-23 ENCOUNTER — Inpatient Hospital Stay (HOSPITAL_COMMUNITY)
Admission: AD | Admit: 2013-01-23 | Discharge: 2013-01-25 | DRG: 765 | Disposition: A | Discharge status: Home / Self Care | Payer: BC Managed Care – PPO | Source: Ambulatory Visit | Attending: Obstetrics and Gynecology | Admitting: Obstetrics and Gynecology

## 2013-01-23 ENCOUNTER — Inpatient Hospital Stay (HOSPITAL_COMMUNITY): Payer: BC Managed Care – PPO | Admitting: Anesthesiology

## 2013-01-23 ENCOUNTER — Encounter (HOSPITAL_COMMUNITY): Payer: Self-pay | Admitting: Anesthesiology

## 2013-01-23 DIAGNOSIS — O139 Gestational [pregnancy-induced] hypertension without significant proteinuria, unspecified trimester: Secondary | ICD-10-CM | POA: Diagnosis present

## 2013-01-23 DIAGNOSIS — O09529 Supervision of elderly multigravida, unspecified trimester: Secondary | ICD-10-CM | POA: Diagnosis present

## 2013-01-23 DIAGNOSIS — O99892 Other specified diseases and conditions complicating childbirth: Secondary | ICD-10-CM | POA: Diagnosis present

## 2013-01-23 DIAGNOSIS — H109 Unspecified conjunctivitis: Secondary | ICD-10-CM | POA: Diagnosis present

## 2013-01-23 DIAGNOSIS — O34219 Maternal care for unspecified type scar from previous cesarean delivery: Principal | ICD-10-CM | POA: Diagnosis present

## 2013-01-23 DIAGNOSIS — J069 Acute upper respiratory infection, unspecified: Secondary | ICD-10-CM | POA: Diagnosis present

## 2013-01-23 SURGERY — Surgical Case
Anesthesia: Spinal | Site: Abdomen | Wound class: Clean Contaminated

## 2013-01-23 MED ORDER — FENTANYL CITRATE 0.05 MG/ML IJ SOLN
INTRAMUSCULAR | Status: AC
  Filled 2013-01-23: qty 2

## 2013-01-23 MED ORDER — DIPHENHYDRAMINE HCL 50 MG/ML IJ SOLN: 12.5000 mg | INTRAMUSCULAR | Status: DC | PRN

## 2013-01-23 MED ORDER — DEXTROSE 5 % IV SOLN
1.0000 ug/kg/h | INTRAVENOUS | Status: DC | PRN
  Filled 2013-01-23: qty 2

## 2013-01-23 MED ORDER — KETOROLAC TROMETHAMINE 30 MG/ML IJ SOLN
INTRAMUSCULAR | Status: AC
  Filled 2013-01-23: qty 1

## 2013-01-23 MED ORDER — SIMETHICONE 80 MG PO CHEW
80.0000 mg | CHEWABLE_TABLET | Freq: Three times a day (TID) | ORAL | Status: DC
  Administered 2013-01-23 – 2013-01-25 (×4): 80 mg via ORAL
  Filled 2013-01-23 (×5): qty 1

## 2013-01-23 MED ORDER — ONDANSETRON HCL 4 MG PO TABS: 4.0000 mg | ORAL_TABLET | ORAL | Status: DC | PRN

## 2013-01-23 MED ORDER — SCOPOLAMINE 1 MG/3DAYS TD PT72
MEDICATED_PATCH | TRANSDERMAL | Status: AC
  Administered 2013-01-23: 1.5 mg via TRANSDERMAL
  Filled 2013-01-23: qty 1

## 2013-01-23 MED ORDER — MEPERIDINE HCL 25 MG/ML IJ SOLN: 6.2500 mg | INTRAMUSCULAR | Status: DC | PRN

## 2013-01-23 MED ORDER — SENNOSIDES-DOCUSATE SODIUM 8.6-50 MG PO TABS
2.0000 | ORAL_TABLET | ORAL | Status: DC
  Administered 2013-01-23 – 2013-01-25 (×2): 2 via ORAL
  Filled 2013-01-23 (×2): qty 2

## 2013-01-23 MED ORDER — TETANUS-DIPHTH-ACELL PERTUSSIS 5-2.5-18.5 LF-MCG/0.5 IM SUSP: 0.5000 mL | Freq: Once | INTRAMUSCULAR | Status: DC

## 2013-01-23 MED ORDER — FENTANYL CITRATE 0.05 MG/ML IJ SOLN: 25.0000 ug | INTRAMUSCULAR | Status: DC | PRN

## 2013-01-23 MED ORDER — OXYTOCIN 10 UNIT/ML IJ SOLN
INTRAMUSCULAR | Status: AC
  Filled 2013-01-23: qty 4

## 2013-01-23 MED ORDER — FENTANYL CITRATE 0.05 MG/ML IJ SOLN
INTRAMUSCULAR | Status: DC | PRN
  Administered 2013-01-23: 75 ug via INTRAVENOUS
  Administered 2013-01-23: 25 ug via INTRATHECAL

## 2013-01-23 MED ORDER — DIBUCAINE 1 % RE OINT: 1.0000 "application " | TOPICAL_OINTMENT | RECTAL | Status: DC | PRN

## 2013-01-23 MED ORDER — NALBUPHINE HCL 10 MG/ML IJ SOLN: 5.0000 mg | INTRAMUSCULAR | Status: DC | PRN

## 2013-01-23 MED ORDER — LACTATED RINGERS IV SOLN
INTRAVENOUS | Status: DC
  Administered 2013-01-23: 14:00:00 via INTRAVENOUS

## 2013-01-23 MED ORDER — SCOPOLAMINE 1 MG/3DAYS TD PT72
1.0000 | MEDICATED_PATCH | Freq: Once | TRANSDERMAL | Status: DC
  Administered 2013-01-23: 1.5 mg via TRANSDERMAL

## 2013-01-23 MED ORDER — METHYLERGONOVINE MALEATE 0.2 MG/ML IJ SOLN: 0.2000 mg | INTRAMUSCULAR | Status: DC | PRN

## 2013-01-23 MED ORDER — NALOXONE HCL 0.4 MG/ML IJ SOLN: 0.4000 mg | INTRAMUSCULAR | Status: DC | PRN

## 2013-01-23 MED ORDER — LANOLIN HYDROUS EX OINT: 1.0000 "application " | TOPICAL_OINTMENT | CUTANEOUS | Status: DC | PRN

## 2013-01-23 MED ORDER — PHENYLEPHRINE 8 MG IN D5W 100 ML (0.08MG/ML) PREMIX OPTIME
INJECTION | INTRAVENOUS | Status: DC | PRN
  Administered 2013-01-23: 60 ug/min via INTRAVENOUS

## 2013-01-23 MED ORDER — CEFAZOLIN SODIUM-DEXTROSE 2-3 GM-% IV SOLR
INTRAVENOUS | Status: AC
  Filled 2013-01-23: qty 50

## 2013-01-23 MED ORDER — LACTATED RINGERS IV SOLN
INTRAVENOUS | Status: DC | PRN
  Administered 2013-01-23 (×2): via INTRAVENOUS

## 2013-01-23 MED ORDER — ONDANSETRON HCL 4 MG/2ML IJ SOLN: 4.0000 mg | Freq: Three times a day (TID) | INTRAMUSCULAR | Status: DC | PRN

## 2013-01-23 MED ORDER — SIMETHICONE 80 MG PO CHEW
80.0000 mg | CHEWABLE_TABLET | ORAL | Status: DC | PRN
  Administered 2013-01-23: 80 mg via ORAL

## 2013-01-23 MED ORDER — ZOLPIDEM TARTRATE 5 MG PO TABS: 5.0000 mg | ORAL_TABLET | Freq: Every evening | ORAL | Status: DC | PRN

## 2013-01-23 MED ORDER — CEFAZOLIN SODIUM-DEXTROSE 2-3 GM-% IV SOLR
2.0000 g | INTRAVENOUS | Status: DC
  Administered 2013-01-23: 2 g via INTRAVENOUS

## 2013-01-23 MED ORDER — MORPHINE SULFATE 0.5 MG/ML IJ SOLN
INTRAMUSCULAR | Status: AC
  Filled 2013-01-23: qty 10

## 2013-01-23 MED ORDER — TRAMADOL HCL 50 MG PO TABS
50.0000 mg | ORAL_TABLET | Freq: Four times a day (QID) | ORAL | Status: DC | PRN
  Administered 2013-01-23 – 2013-01-25 (×5): 50 mg via ORAL
  Filled 2013-01-23 (×5): qty 1

## 2013-01-23 MED ORDER — ONDANSETRON HCL 4 MG/2ML IJ SOLN: 4.0000 mg | INTRAMUSCULAR | Status: DC | PRN

## 2013-01-23 MED ORDER — LACTATED RINGERS IV SOLN
INTRAVENOUS | Status: DC | PRN
  Administered 2013-01-23: 11:00:00 via INTRAVENOUS

## 2013-01-23 MED ORDER — PRENATAL MULTIVITAMIN CH
1.0000 | ORAL_TABLET | Freq: Every day | ORAL | Status: DC
  Administered 2013-01-24 – 2013-01-25 (×2): 1 via ORAL
  Filled 2013-01-23 (×2): qty 1

## 2013-01-23 MED ORDER — LACTATED RINGERS IV SOLN
Freq: Once | INTRAVENOUS | Status: AC
  Administered 2013-01-23: 10:00:00 via INTRAVENOUS

## 2013-01-23 MED ORDER — BUPIVACAINE HCL (PF) 0.25 % IJ SOLN
INTRAMUSCULAR | Status: DC | PRN
  Administered 2013-01-23: 10 mL

## 2013-01-23 MED ORDER — DIPHENHYDRAMINE HCL 50 MG/ML IJ SOLN: 25.0000 mg | INTRAMUSCULAR | Status: DC | PRN

## 2013-01-23 MED ORDER — BUPIVACAINE HCL (PF) 0.25 % IJ SOLN
INTRAMUSCULAR | Status: AC
  Filled 2013-01-23: qty 30

## 2013-01-23 MED ORDER — SODIUM CHLORIDE 0.9 % IJ SOLN: 3.0000 mL | INTRAMUSCULAR | Status: DC | PRN

## 2013-01-23 MED ORDER — METOCLOPRAMIDE HCL 5 MG/ML IJ SOLN
10.0000 mg | Freq: Three times a day (TID) | INTRAMUSCULAR | Status: DC | PRN
  Administered 2013-01-23: 10 mg via INTRAVENOUS

## 2013-01-23 MED ORDER — OXYTOCIN 40 UNITS IN LACTATED RINGERS INFUSION - SIMPLE MED: 62.5000 mL/h | INTRAVENOUS | Status: AC

## 2013-01-23 MED ORDER — SIMETHICONE 80 MG PO CHEW
80.0000 mg | CHEWABLE_TABLET | ORAL | Status: DC
  Filled 2013-01-23: qty 1

## 2013-01-23 MED ORDER — MENTHOL 3 MG MT LOZG: 1.0000 | LOZENGE | OROMUCOSAL | Status: DC | PRN

## 2013-01-23 MED ORDER — DIPHENHYDRAMINE HCL 25 MG PO CAPS: 25.0000 mg | ORAL_CAPSULE | Freq: Four times a day (QID) | ORAL | Status: DC | PRN

## 2013-01-23 MED ORDER — IBUPROFEN 600 MG PO TABS
600.0000 mg | ORAL_TABLET | Freq: Four times a day (QID) | ORAL | Status: DC
  Administered 2013-01-23 – 2013-01-25 (×8): 600 mg via ORAL
  Filled 2013-01-23 (×8): qty 1

## 2013-01-23 MED ORDER — METOCLOPRAMIDE HCL 5 MG/ML IJ SOLN
INTRAMUSCULAR | Status: AC
  Administered 2013-01-23: 10 mg via INTRAVENOUS
  Filled 2013-01-23: qty 2

## 2013-01-23 MED ORDER — ONDANSETRON HCL 4 MG/2ML IJ SOLN
INTRAMUSCULAR | Status: AC
  Filled 2013-01-23: qty 2

## 2013-01-23 MED ORDER — BUPIVACAINE IN DEXTROSE 0.75-8.25 % IT SOLN
INTRATHECAL | Status: DC | PRN
  Administered 2013-01-23: 2 mL via INTRATHECAL

## 2013-01-23 MED ORDER — METHYLERGONOVINE MALEATE 0.2 MG PO TABS: 0.2000 mg | ORAL_TABLET | ORAL | Status: DC | PRN

## 2013-01-23 MED ORDER — OXYCODONE-ACETAMINOPHEN 5-325 MG PO TABS: 1.0000 | ORAL_TABLET | ORAL | Status: DC | PRN

## 2013-01-23 MED ORDER — KETOROLAC TROMETHAMINE 30 MG/ML IJ SOLN
INTRAMUSCULAR | Status: DC | PRN
  Administered 2013-01-23: 30 mg via INTRAVENOUS

## 2013-01-23 MED ORDER — MORPHINE SULFATE (PF) 0.5 MG/ML IJ SOLN
INTRAMUSCULAR | Status: DC | PRN
  Administered 2013-01-23: .15 mg via INTRATHECAL

## 2013-01-23 MED ORDER — KETOROLAC TROMETHAMINE 30 MG/ML IJ SOLN: 30.0000 mg | Freq: Four times a day (QID) | INTRAMUSCULAR | Status: DC

## 2013-01-23 MED ORDER — DIPHENHYDRAMINE HCL 25 MG PO CAPS: 25.0000 mg | ORAL_CAPSULE | ORAL | Status: DC | PRN

## 2013-01-23 MED ORDER — PHENYLEPHRINE 8 MG IN D5W 100 ML (0.08MG/ML) PREMIX OPTIME
INJECTION | INTRAVENOUS | Status: AC
  Filled 2013-01-23: qty 100

## 2013-01-23 MED ORDER — OXYTOCIN 10 UNIT/ML IJ SOLN
40.0000 [IU] | INTRAVENOUS | Status: DC | PRN
  Administered 2013-01-23: 40 [IU] via INTRAVENOUS

## 2013-01-23 MED ORDER — ONDANSETRON HCL 4 MG/2ML IJ SOLN
INTRAMUSCULAR | Status: DC | PRN
  Administered 2013-01-23: 4 mg via INTRAVENOUS

## 2013-01-23 MED ORDER — WITCH HAZEL-GLYCERIN EX PADS: 1.0000 "application " | MEDICATED_PAD | CUTANEOUS | Status: DC | PRN

## 2013-01-23 SURGICAL SUPPLY — 31 items
CLAMP CORD UMBIL (MISCELLANEOUS) IMPLANT
CLOTH BEACON ORANGE TIMEOUT ST (SAFETY) ×2 IMPLANT
CONTAINER PREFILL 10% NBF 15ML (MISCELLANEOUS) IMPLANT
DERMABOND ADHESIVE PROPEN (GAUZE/BANDAGES/DRESSINGS) ×1
DERMABOND ADVANCED .7 DNX6 (GAUZE/BANDAGES/DRESSINGS) ×1 IMPLANT
DRAPE LG THREE QUARTER DISP (DRAPES) IMPLANT
DRSG OPSITE POSTOP 4X10 (GAUZE/BANDAGES/DRESSINGS) ×2 IMPLANT
DURAPREP 26ML APPLICATOR (WOUND CARE) ×2 IMPLANT
ELECT REM PT RETURN 9FT ADLT (ELECTROSURGICAL) ×2
ELECTRODE REM PT RTRN 9FT ADLT (ELECTROSURGICAL) ×1 IMPLANT
EXTRACTOR VACUUM M CUP 4 TUBE (SUCTIONS) IMPLANT
GLOVE BIO SURGEON STRL SZ7.5 (GLOVE) ×2 IMPLANT
GOWN PREVENTION PLUS XLARGE (GOWN DISPOSABLE) ×2 IMPLANT
GOWN STRL REIN XL XLG (GOWN DISPOSABLE) ×2 IMPLANT
KIT ABG SYR 3ML LUER SLIP (SYRINGE) IMPLANT
NEEDLE HYPO 25X1 1.5 SAFETY (NEEDLE) IMPLANT
NEEDLE HYPO 25X5/8 SAFETYGLIDE (NEEDLE) ×2 IMPLANT
NS IRRIG 1000ML POUR BTL (IV SOLUTION) ×2 IMPLANT
PACK C SECTION WH (CUSTOM PROCEDURE TRAY) ×2 IMPLANT
STAPLER VISISTAT 35W (STAPLE) ×2 IMPLANT
SUT MNCRL 0 VIOLET CTX 36 (SUTURE) ×2 IMPLANT
SUT MON AB 2-0 CT1 27 (SUTURE) ×2 IMPLANT
SUT MON AB-0 CT1 36 (SUTURE) ×4 IMPLANT
SUT MONOCRYL 0 CTX 36 (SUTURE) ×2
SUT PLAIN 0 NONE (SUTURE) IMPLANT
SUT PLAIN 2 0 XLH (SUTURE) ×2 IMPLANT
SUT VIC AB 4-0 KS 27 (SUTURE) ×2 IMPLANT
SYR CONTROL 10ML LL (SYRINGE) ×2 IMPLANT
TOWEL OR 17X24 6PK STRL BLUE (TOWEL DISPOSABLE) ×2 IMPLANT
TRAY FOLEY CATH 14FR (SET/KITS/TRAYS/PACK) ×2 IMPLANT
WATER STERILE IRR 1000ML POUR (IV SOLUTION) IMPLANT

## 2013-01-23 NOTE — Progress Notes (Signed)
Patient ID: Sharon Trujillo, female   DOB: 05-19-1975, 37 y.o.   MRN: [id number] Patient seen and examined. Consent witnessed and signed. No changes noted. Update completed. CBC    Component Value Date/Time   WBC 13.4* 01/21/2013 1030   RBC 4.07 01/21/2013 1030   HGB 13.1 01/21/2013 1030   HCT 38.0 01/21/2013 1030   PLT 221 01/21/2013 1030   MCV 93.4 01/21/2013 1030   MCH 32.2 01/21/2013 1030   MCHC 34.5 01/21/2013 1030   RDW 13.5 01/21/2013 1030

## 2013-01-23 NOTE — Op Note (Signed)
Cesarean Section Procedure Note  Indications: previous uterine incision kerr x one  Pre-operative Diagnosis: 39 week 5 day pregnancy.  Post-operative Diagnosis: same  Surgeon: Lenoard Aden   Assistants: none  Anesthesia: Local anesthesia 0.25.% bupivacaine and Spinal anesthesia  ASA Class: 2  Procedure Details  The patient was seen in the Holding Room. The risks, benefits, complications, treatment options, and expected outcomes were discussed with the patient.  The patient concurred with the proposed plan, giving informed consent. The risks of anesthesia, infection, bleeding and possible injury to other organs discussed. Injury to bowel, bladder, or ureter with possible need for repair discussed. Possible need for transfusion with secondary risks of hepatitis or HIV acquisition discussed. Post operative complications to include but not limited to DVT, PE and Pneumonia noted. The site of surgery properly noted/marked. The patient was taken to Operating Room # 9, identified as Sharon Trujillo and the procedure verified as C-Section Delivery. A Time Out was held and the above information confirmed.  After induction of anesthesia, the patient was draped and prepped in the usual sterile manner. A Pfannenstiel incision was made and carried down through the subcutaneous tissue to the fascia. Fascial incision was made and extended transversely using Mayo scissors. The fascia was separated from the underlying rectus tissue superiorly and inferiorly. The peritoneum was identified and entered. Peritoneal incision was extended longitudinally. The utero-vesical peritoneal reflection was incised transversely and the bladder flap was bluntly freed from the lower uterine segment. A low transverse uterine incision(Kerr hysterotomy) was made. Delivered from OA presentation was a  female with Apgar scores of 9 at one minute and 9 at five minutes. Bulb suctioning gently performed. Neonatal team in attendance.After  the umbilical cord was clamped and cut cord blood was obtained for evaluation. The placenta was removed intact and appeared normal. The uterus was curetted with a dry lap pack. Good hemostasis was noted.The uterine outline, tubes and ovaries appeared normal. The uterine incision was closed with running locked sutures of 0 Monocryl x 2 layers. Hemostasis was observed. The parietal peritoneum was closed with a running 2-0 Monocryl suture. The fascia was then reapproximated with running sutures of 0 Monocryl. Oxbow layer closed with 2-0 plain. The skin was reapproximated with 4-0 vicryl.  Instrument, sponge, and needle counts were correct prior the abdominal closure and at the conclusion of the case.   Findings: As noted  Estimated Blood Loss:  300 mL         Drains: foley                 Specimens: placenta                 Complications:  None; patient tolerated the procedure well.         Disposition: PACU - hemodynamically stable.         Condition: stable  Attending Attestation: I performed the procedure.

## 2013-01-23 NOTE — Transfer of Care (Signed)
Immediate Anesthesia Transfer of Care Note  Patient: Sharon Trujillo  Procedure(s) Performed: Procedure(s): CESAREAN SECTION (N/A)  Patient Location: PACU  Anesthesia Type:Spinal  Level of Consciousness: awake, alert  and oriented  Airway & Oxygen Therapy: Patient Spontanous Breathing  Post-op Assessment: Report given to PACU RN and Post -op Vital signs reviewed and stable  Post vital signs: Reviewed and stable  Complications: No apparent anesthesia complications

## 2013-01-23 NOTE — H&P (Signed)
Sharon Trujillo, KOOP NO.:  [id number]  MEDICAL RECORD NO.:  [id number]  LOCATION:                                 FACILITY:  PHYSICIAN:  Lenoard Aden, M.D.DATE OF BIRTH:  August 25, 1975  DATE OF ADMISSION: DATE OF DISCHARGE:                             HISTORY & PHYSICAL   CHIEF COMPLAINT:  Previous C-section, for repeat.  HISTORY OF PRESENT ILLNESS:  She is a 37 year old white female, G3, P1 at 7-40 weeks' gestation who presents for elective repeat C-section.  MEDICATIONS:  Nexium, Valtrex, Tums, and prenatal vitamins.  ALLERGIES:  Milk, codeine, penicillin, and sulfa.  FAMILY HISTORY:  Alzheimer's, breast cancer, cerebrovascular disease, previous history of C-section x1, miscarriage x1.  SURGICAL HISTORY:  D and E at 8 weeks, C-section, and femoral hernia repair.  PRENATAL COURSE:  Uncomplicated.  PHYSICAL EXAMINATION:  GENERAL:  Well-developed, well-nourished white female, in no acute distress. HEENT:  Normal. NECK:  Supple.  Full range of motion. LUNGS:  Clear. HEART:  Regular rhythm. ABDOMEN:  Soft, gravid, nontender.  Estimated fetal weight 7.5 pounds. Cervix is unclosed, 70% vertex, -2. EXTREMITIES:  No cords. PELVIC:  Nonfocal. SKIN:  Intact.  IMPRESSION:  A 40-week intrauterine pregnancy, repeat cesarean section.  PLAN:  Proceed with elective repeat low segment transverse cesarean section.  Risks of anesthesia, infection, bleeding, injury to surrounding organs, need for repair was discussed, delayed versus immediate complications to include bowel and bladder injury noted.  The patient acknowledges and wishes to proceed.     Lenoard Aden, M.D.     RJT/MEDQ  D:  01/22/2013  T:  01/22/2013  Job:  [id number]

## 2013-01-23 NOTE — Lactation Note (Signed)
This note was copied from the chart of Sharon Creola Krotz. Lactation Consultation Note    Initial consult with this mom and baby, now 26 hours old. //Mom needing some review on how to latch a newborn, but baby latched well with strong suckles. Baby and me book reviewed, breast feeding pages. Lactation services also reviewed. Mom knows to call for questions/concerns.  Patient Name: Sharon Trujillo [id number] Date: 01/23/2013 Reason for consult: Initial assessment   Maternal Data Formula Feeding for Exclusion: No Infant to breast within first hour of birth: Yes Has patient been taught Hand Expression?: Yes Does the patient have breastfeeding experience prior to this delivery?: Yes  Feeding Feeding Type: Breast Fed Length of feed: 10 min  LATCH Score/Interventions Latch: Grasps breast easily, tongue down, lips flanged, rhythmical sucking.  Audible Swallowing: A few with stimulation Intervention(s): Skin to skin;Hand expression  Type of Nipple: Everted at rest and after stimulation (large but baby handles well)  Comfort (Breast/Nipple): Soft / non-tender     Hold (Positioning): Assistance needed to correctly position infant at breast and maintain latch. Intervention(s): Breastfeeding basics reviewed;Support Pillows;Position options;Skin to skin  LATCH Score: 8  Lactation Tools Discussed/Used     Consult Status Consult Status: Follow-up Date: 01/24/13 Follow-up type: In-patient    Alfred Levins 01/23/2013, 8:10 PM

## 2013-01-23 NOTE — Preoperative (Signed)
Beta Blockers   Reason not to administer Beta Blockers:Not Applicable 

## 2013-01-23 NOTE — Anesthesia Preprocedure Evaluation (Signed)
Anesthesia Evaluation  Patient identified by MRN, date of birth, ID band Patient awake    Reviewed: Allergy & Precautions, H&P , NPO status , Patient's Chart, lab work & pertinent test results  Airway Mallampati: II TM Distance: >3 FB Neck ROM: Full    Dental no notable dental hx. (+) Teeth Intact   Pulmonary neg pulmonary ROS,  breath sounds clear to auscultation  Pulmonary exam normal       Cardiovascular hypertension, + dysrhythmias Rhythm:Regular Rate:Normal     Neuro/Psych negative neurological ROS  negative psych ROS   GI/Hepatic Neg liver ROS, GERD-  Medicated and Controlled,  Endo/Other  negative endocrine ROS  Renal/GU negative Renal ROS  negative genitourinary   Musculoskeletal   Abdominal   Peds  Hematology negative hematology ROS (+)   Anesthesia Other Findings   Reproductive/Obstetrics (+) Pregnancy Previous C/Section HSV                           Anesthesia Physical Anesthesia Plan  ASA: II  Anesthesia Plan: Spinal   Post-op Pain Management:    Induction:   Airway Management Planned: Natural Airway  Additional Equipment:   Intra-op Plan:   Post-operative Plan:   Informed Consent: I have reviewed the patients History and Physical, chart, labs and discussed the procedure including the risks, benefits and alternatives for the proposed anesthesia with the patient or authorized representative who has indicated his/her understanding and acceptance.     Plan Discussed with: Anesthesiologist, CRNA and Surgeon  Anesthesia Plan Comments:         Anesthesia Quick Evaluation

## 2013-01-23 NOTE — Anesthesia Procedure Notes (Signed)
Spinal  Patient location during procedure: OR Start time: 01/23/2013 10:40 AM Staffing Anesthesiologist: Breckyn Ticas A. Performed by: anesthesiologist  Preanesthetic Checklist Completed: patient identified, site marked, surgical consent, pre-op evaluation, timeout performed, IV checked, risks and benefits discussed and monitors and equipment checked Spinal Block Patient position: sitting Prep: site prepped and draped and DuraPrep Patient monitoring: heart rate, cardiac monitor, continuous pulse ox and blood pressure Approach: midline Location: L3-4 Injection technique: single-shot Needle Needle type: Sprotte  Needle gauge: 24 G Needle length: 9 cm Assessment Sensory level: T4 Additional Notes Patient tolerated procedure well. Adequate sensory level.

## 2013-01-23 NOTE — Anesthesia Postprocedure Evaluation (Signed)
  Anesthesia Post-op Note  Patient: Sharon Trujillo  Procedure(s) Performed: Procedure(s): CESAREAN SECTION (N/A)  Patient Location: PACU  Anesthesia Type:Spinal  Level of Consciousness: awake, alert  and oriented  Airway and Oxygen Therapy: Patient Spontanous Breathing  Post-op Pain: none  Post-op Assessment: Post-op Vital signs reviewed, Patient's Cardiovascular Status Stable, Respiratory Function Stable, Patent Airway, No signs of Nausea or vomiting, Pain level controlled, No headache and No backache  Post-op Vital Signs: Reviewed and stable  Complications: No apparent anesthesia complications

## 2013-01-24 LAB — CBC
HCT: 32.1 % — ABNORMAL LOW (ref 36.0–46.0)
Hemoglobin: 11 g/dL — ABNORMAL LOW (ref 12.0–15.0)
MCHC: 34.3 g/dL (ref 30.0–36.0)
MCV: 93.3 fL (ref 78.0–100.0)
RBC: 3.44 MIL/uL — ABNORMAL LOW (ref 3.87–5.11)
WBC: 8.5 10*3/uL (ref 4.0–10.5)

## 2013-01-24 MED ORDER — GUAIFENESIN-DM 100-10 MG/5ML PO SYRP
10.0000 mL | ORAL_SOLUTION | ORAL | Status: DC | PRN
  Administered 2013-01-24 – 2013-01-25 (×2): 10 mL via ORAL
  Filled 2013-01-24: qty 10

## 2013-01-24 NOTE — Progress Notes (Signed)
POSTOPERATIVE DAY # 1 S/P repeat LTCS   S:         Reports feeling well, but a little sore              Tolerating po intake / no nausea / no vomiting / passed flatus / no BM             Bleeding is light             Pain controlled withibuprofen (OTC)             Up ad lib / ambulatory/ voiding QS  Newborn berast feeding   O:  VS: BP 102/58  Pulse 69  Temp(Src) 98 F (36.7 C) (Oral)  Resp 18  Ht 5' 10.75" (1.797 m)  Wt 95.255 kg (210 lb)  BMI 29.50 kg/m2  SpO2 96%   LABS:               Recent Labs  01/21/13 1030 01/24/13 0624  WBC 13.4* 8.5  HGB 13.1 11.0*  PLT 221 193               Bloodtype: --/--/O POS (11/12 1030)  Rubella: Immune (04/14 0000)                                             I&O: Intake/Output     11/14 0701 - 11/15 0700 11/15 0701 - 11/16 0700   P.O. 480    I.V. (mL/kg) 2637 (27.7)    Total Intake(mL/kg) 3117 (32.7)    Urine (mL/kg/hr) 2820    Blood 500    Total Output 3320     Net -203                       Physical Exam:             Alert and Oriented X3  Lungs: Clear and unlabored  Heart: regular rate and rhythm / no mumurs  Abdomen: soft, non-tender, non-distended, +BS             Fundus: firm, non-tender, U-U             Dressing: Honeycomb and tegaderm dry and intact              Incision:  approximated with suture / no erythema / no ecchymosis / no drainage  Perineum: intact, no edema  Lochia: light rubra, no clots  Extremities: no edema, no calf pain or tenderness, negative Homans  A:        POD # 1 S/P repeat LTCS            Stable status  P:        Routine postoperative care              Encourage ambulation as tolerated and warm liquids to promote bowel motility     Cyndee Brightly SNM 01/24/2013, 10:01 AM

## 2013-01-24 NOTE — Anesthesia Postprocedure Evaluation (Signed)
  Anesthesia Post-op Note  Patient: Sharon Trujillo  Procedure(s) Performed: Procedure(s): CESAREAN SECTION (N/A)  Patient Location: Mother/Baby  Anesthesia Type:Epidural  Level of Consciousness: awake, alert  and oriented  Airway and Oxygen Therapy: Patient Spontanous Breathing  Post-op Pain: mild  Post-op Assessment: Patient's Cardiovascular Status Stable, RESPIRATORY FUNCTION UNSTABLE, Patent Airway, Pain level controlled, No headache, No backache, No residual numbness and No residual motor weakness  Post-op Vital Signs: stable  Complications: No apparent anesthesia complications

## 2013-01-24 NOTE — Progress Notes (Signed)
Seen and agree - Dr Billy Coast in to visit with patient this am as well. Possible early DC POD 2  Marlinda Mike [facility] 01/24/2013 @ 1200

## 2013-01-25 MED ORDER — TRAMADOL HCL 50 MG PO TABS: 50.0000 mg | ORAL_TABLET | Freq: Four times a day (QID) | ORAL | Status: DC | PRN

## 2013-01-25 MED ORDER — AZITHROMYCIN 250 MG PO TABS: 250.0000 mg | ORAL_TABLET | Freq: Every day | ORAL | Status: DC

## 2013-01-25 MED ORDER — IBUPROFEN 600 MG PO TABS: 600.0000 mg | ORAL_TABLET | Freq: Four times a day (QID) | ORAL | Status: DC

## 2013-01-25 NOTE — Progress Notes (Signed)
Seen and agree RX for URI given today - has RX for conjunctivitis at home Dc home  Marlinda Mike CNM [facility]

## 2013-01-25 NOTE — Discharge Summary (Signed)
POSTOPERATIVE DISCHARGE SUMMARY:  Patient ID: Sharon Trujillo MRN: [id number] DOB/AGE: 1975-09-04 37 y.o.  Admit date: 01/23/2013 Admission Diagnoses: 39 weeks previous cesarean section - elective repeat  Discharge date:  01/25/2013 Discharge Diagnoses: POD 2 s/p cesarean section / URI / conjunctivitis / gestational hypertension  Prenatal history: G3P2011   EDC : 01/25/2013, by Other Basis  Prenatal care at [facility]  Primary provider : Taavon Prenatal course complicated by [facility] / previous CS  Prenatal Labs: ABO, Rh: --/--/O POS (11/12 1030)  Antibody: NEG (11/12 1030) Rubella: Immune (04/14 0000)  RPR: NON REACTIVE (11/12 1030)  HBsAg: Negative (04/14 0000)  HIV: Non-reactive (04/14 0000)    Medical / Surgical History :  Past medical history:  Past Medical History  Diagnosis Date  . HSV (herpes simplex virus) anogenital infection   . Post-dates pregnancy 11/09/2010  . Gestational hypertension without significant proteinuria in third trimester 11/09/2010  . Postpartum care following vaginal delivery (11/09/10) 11/10/2010  . Postpartum care following cesarean delivery (8/30) 11/10/2010  . Pregnancy   . Psoriasis   . GERD (gastroesophageal reflux disease)     pregnancy only  . Dysrhythmia     stress related palpitations  . Lactose intolerance     Past surgical history:  Past Surgical History  Procedure Laterality Date  . Leep    . Femoral hernia repair    . Cesarean section  11/09/2010    Procedure: CESAREAN SECTION;  Surgeon: Lenoard Aden, MD;  Location: [facility];  Service: Gynecology;  Laterality: N/A;  Primary Cesarean Section with birth of baby boy @ 2316  . Dilation and evacuation  01/16/2012    Procedure: DILATATION AND EVACUATION;  Surgeon: Lenoard Aden, MD;  Location: [facility];  Service: Gynecology;  Laterality: N/A;    Family History:  Family History  Problem Relation Age of Onset  . Cancer Maternal Grandmother   . Stroke Maternal  Grandmother   . Cancer Paternal Grandfather   . Arthritis Mother   . Hyperlipidemia Mother   . Hypertension Maternal Uncle   . Mental retardation Paternal Aunt   . Heart disease Paternal Grandmother     Social History:  reports that she has never smoked. She does not have any smokeless tobacco history on file. She reports that she does not drink alcohol or use illicit drugs.  Allergies: Codeine; Penicillins; Sulfa antibiotics; and Trimethoprim    Procedures: Cesarean section delivery on 01/23/2013 with delivery Dr Billy Coast   See operative report for further details APGAR (1 MIN): 9   APGAR (5 MINS): 9    Postoperative / postpartum course:  Uncomplicated with discharge on POD 2 Complicated by: URI and conjunctivitis - onset prior to admission  Physical Exam:   VSS: Temp:  [98.4 F (36.9 C)-99 F (37.2 C)] 98.4 F (36.9 C) (11/16 0865) Pulse Rate:  [84-96] 84 (11/16 0633) Resp:  [18] 18 (11/16 0633) BP: (113-123)/(70-73) 123/73 mmHg (11/16 7846)  LABS:  Recent Labs  01/24/13 0624  WBC 8.5  HGB 11.0*  PLT 193    General: malaise with URI / ambulatory Heart: RRR Lungs: clear  Abdomen: soft and non-tender / non-distended / active BS  Extremities: trace  edema / negative Homans  Dressing: intact honeycomb dressing Incision:  approximated with sutures / no erythema / no ecchymosis / no drainage  Discharge Instructions:  Discharged Condition: stable  Activity: pelvic rest and postoperative restrictions x 2   Diet: routine  Medications:    Medication  List         azithromycin 250 MG tablet  Commonly known as:  ZITHROMAX Z-PAK  Take 1 tablet (250 mg total) by mouth daily.     Betamethasone Valerate 0.12 % foam  Apply 1 application topically daily as needed (for psoriasis).     CITRANATAL ASSURE 35-1 & 300 MG tablet  Take 1 tablet by mouth daily.     desoximetasone 0.05 % cream  Commonly known as:  TOPICORT  Apply 1 application topically daily.      esomeprazole 20 MG capsule  Commonly known as:  NEXIUM  Take 20 mg by mouth daily at 12 noon.     hydrocortisone 2.5 % rectal cream  Commonly known as:  ANUSOL-HC  Place 1 application rectally daily as needed for hemorrhoids or itching.     ibuprofen 600 MG tablet  Commonly known as:  ADVIL,MOTRIN  Take 1 tablet (600 mg total) by mouth every 6 (six) hours.     PROBIOTIC PO  Take 1 capsule by mouth daily.     traMADol 50 MG tablet  Commonly known as:  ULTRAM  Take 1 tablet (50 mg total) by mouth every 6 (six) hours as needed for moderate pain.     VALTREX 500 MG tablet  Generic drug:  valACYclovir  Take 500 mg by mouth 2 (two) times daily.        Wound Care: keep clean and dry / remove honeycomb POD 5 Postpartum Instructions: [facility] discharge booklet - instructions reviewed  Discharge to: Home  Follow up :   [facility] in 6 weeks for routine postpartum visit with Taavon                Signed: Marlinda Mike CNM, MSN, [facility] 01/25/2013, 11:38 AM

## 2013-01-25 NOTE — Progress Notes (Signed)
POSTOPERATIVE D AY # 2 S/P LTCS    S:         Pt reports feeling good, but has developed a productive cough and thinks she may have conjunctivitis in her left eye.  Reports left ocular redness and copious thick d/c.  States she has ocular rosacea and recurrent conjunctivitis, for which she uses an eye mask and nepafenac opthalamic suspension at home.  Pt started using these eye drops, as well as ofloxacin drops last night with some minor improvement.  States she has ben coughing up thick, greenish phlegm this morning.  Still would like to go home today.             Tolerating po intake / no nausea / no vomiting / passing flatus / no BM             Bleeding is light             Pain controlled withMotrin and tramadol             Up ad lib / ambulatory/ voiding QS  Newborn breast feeding    O:  VS: BP 123/73  Pulse 84  Temp(Src) 98.4 F (36.9 C) (Oral)  Resp 18  Ht 5' 10.75" (1.797 m)  Wt 95.255 kg (210 lb)  BMI 29.50 kg/m2  SpO2 96%   LABS:               Recent Labs  01/24/13 0624  WBC 8.5  HGB 11.0*  PLT 193               Bloodtype: --/--/O POS (11/12 1030)  Rubella: Immune (04/14 0000)                                                          Physical Exam:             Alert and Oriented X3  Lungs: Clear to all lung fields and unlabored  Heart: regular rate and rhythm / no mumurs  Abdomen: soft, non-tender, non-distended +BS             Fundus: firm, non-tender, U-1             Dressing Honeycomb and tegaderm dry and intact              Incision:  approximated with suture / no erythema / no ecchymosis / no drainage  Perineum: intact, no swelling  Lochia: scant-light rubra, no clots  Extremities: no edema, no calf pain or tenderness, negative Homans  A/P:         1.) POD # 2 S/P LTCS            Routine postoperative care   2.) Conjunctivitis--left eye  Continue home eye drops and eye mask  Strict hand hygiene to prevent neonatal transmission  3.)  URI  Azithromycin pack x 5-6 days  Encourage PO hydration and rest               Cyndee Brightly SNM 01/25/2013, 10:11 AM

## 2013-01-26 ENCOUNTER — Encounter (HOSPITAL_COMMUNITY): Payer: Self-pay | Admitting: Obstetrics and Gynecology

## 2013-10-28 ENCOUNTER — Ambulatory Visit (INDEPENDENT_AMBULATORY_CARE_PROVIDER_SITE_OTHER): Payer: BC Managed Care – PPO | Admitting: Family Medicine

## 2013-10-28 ENCOUNTER — Encounter: Payer: Self-pay | Admitting: Family Medicine

## 2013-10-28 VITALS — BP 115/79 | HR 73 | Temp 98.4°F | Ht 70.0 in | Wt 181.0 lb

## 2013-10-28 DIAGNOSIS — J209 Acute bronchitis, unspecified: Secondary | ICD-10-CM

## 2013-10-28 NOTE — Progress Notes (Signed)
Office Note 11/01/2013  CC: cough  HPI:  Sharon Trujillo is a 38 y.o. White female who is here to establish care and discuss URI sx's. Patient's most recent primary MD: no FP or internist.  Has GYN MD: Dr. Billy Coastaavon. Old records in EPIC/HL EMR were reviewed prior to or during today's visit.  Onset of runny nose about 10d/a, settled into deep chest cough, productive of lots of sputum, worse when supine. No fatigue/malaise.  No fever.  NO pain in face or upper teeth.  NO ST.  Question of wheeze when laying supine, mild feeling of SOB/not breathing as easily as normal.  Breast feeding her 439 mo old daughter.  Past Medical History  Diagnosis Date  . HSV (herpes simplex virus) anogenital infection   . Gestational hypertension without significant proteinuria in third trimester 11/09/2010  . Psoriasis   . GERD (gastroesophageal reflux disease)     pregnancy only  . Palpitations     stress related palpitations  . Lactose intolerance   . Psoriasis     occ left knee pain WITHOUT swelling--resolves with use of CLARITIN (??.)  No hx suspicious for psoriatic arthritis.    Past Surgical History  Procedure Laterality Date  . Leep  2005  . Femoral hernia repair  2009    left  . Cesarean section  11/09/2010    Procedure: CESAREAN SECTION;  Surgeon: Lenoard Adenichard J Taavon, MD;  Location: [facility];  Service: Gynecology;  Laterality: N/A;  Primary Cesarean Section with birth of baby boy @ 2316  . Dilation and evacuation  01/16/2012    Procedure: DILATATION AND EVACUATION;  Surgeon: Lenoard Adenichard J Taavon, MD;  Location: [facility];  Service: Gynecology;  Laterality: N/A;  . Cesarean section N/A 01/23/2013    Procedure: CESAREAN SECTION;  Surgeon: Lenoard Adenichard J Taavon, MD;  Location: [facility];  Service: Obstetrics;  Laterality: N/A;  . Colonoscopy  04/2008    done for hematochezia (normal).  Needs screening TCS age 38    Family History  Problem Relation Age of Onset  . Cancer Maternal Grandmother   . Stroke Maternal  Grandmother   . Cancer Paternal Grandfather   . Arthritis Mother   . Hyperlipidemia Mother   . Hypertension Maternal Uncle   . Mental retardation Paternal Aunt   . Heart disease Paternal Grandmother     History   Social History  . Marital Status: Married    Spouse Name: N/A    Number of Children: N/A  . Years of Education: N/A   Occupational History  . Not on file.   Social History Main Topics  . Smoking status: Never Smoker   . Smokeless tobacco: Not on file  . Alcohol Use: No  . Drug Use: No  . Sexual Activity: Yes     Comment: nuvaring   Other Topics Concern  . Not on file   Social History Narrative   Married, 2 children.   [place] since 2006.  [place] in [place].   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   No T/A/Ds.    Outpatient Encounter Prescriptions as of 10/28/2013  Medication Sig  . Betamethasone Valerate 0.12 % foam Apply 1 application topically daily as needed (for psoriasis).  Marland Kitchen. desoximetasone (TOPICORT) 0.05 % cream Apply 1 application topically daily.  . hydrocortisone (ANUSOL-HC) 2.5 % rectal cream Place 1 application rectally daily as needed for hemorrhoids or itching.  Marland Kitchen. ibuprofen (ADVIL,MOTRIN) 600 MG tablet Take 1 tablet (600 mg total) by mouth every 6 (six) hours.  .Marland Kitchen  Prenat w/o A-FeCbGl-DSS-FA-DHA (CITRANATAL ASSURE) 35-1 & 300 MG tablet Take 1 tablet by mouth daily.    . Probiotic Product (PROBIOTIC PO) Take 1 capsule by mouth daily.  . valACYclovir (VALTREX) 500 MG tablet Take 500 mg by mouth 2 (two) times daily.  . [DISCONTINUED] traMADol (ULTRAM) 50 MG tablet Take 1 tablet (50 mg total) by mouth every 6 (six) hours as needed for moderate pain.  . [DISCONTINUED] azithromycin (ZITHROMAX Z-PAK) 250 MG tablet Take 1 tablet (250 mg total) by mouth daily.  . [DISCONTINUED] esomeprazole (NEXIUM) 20 MG capsule Take 20 mg by mouth daily at 12 noon.    Allergies  Allergen Reactions  . Codeine Itching and Nausea And Vomiting    Can tolerate  Tramadol  . Penicillins Hives and Swelling  . Sulfa Antibiotics Swelling  . Trimethoprim Swelling    ROS Review of Systems  Constitutional: Negative for fever, chills, appetite change and fatigue.  HENT: Negative for congestion, dental problem, ear pain and sore throat.   Eyes: Negative for discharge, redness and visual disturbance.  Respiratory: Positive for cough. Negative for chest tightness.   Cardiovascular: Negative for chest pain, palpitations and leg swelling.  Gastrointestinal: Negative for nausea, vomiting, abdominal pain, diarrhea and blood in stool.  Genitourinary: Negative for dysuria, urgency, frequency, hematuria, flank pain and difficulty urinating.  Musculoskeletal: Negative for arthralgias, back pain, joint swelling, myalgias and neck stiffness.  Skin: Negative for pallor and rash.  Neurological: Negative for dizziness, speech difficulty, weakness and headaches.  Hematological: Negative for adenopathy. Does not bruise/bleed easily.  Psychiatric/Behavioral: Negative for confusion and sleep disturbance. The patient is not nervous/anxious.     PE; Blood pressure 115/79, pulse 73, temperature 98.4 F (36.9 C), temperature source Temporal, height 5\' 10"  (1.778 m), weight 181 lb (82.101 kg), SpO2 99.00%, unknown if currently breastfeeding. VS: noted--normal. Gen: alert, NAD, WELL-APPEARING. HEENT: eyes without injection, drainage, or swelling.  Ears: EACs clear, TMs with normal light reflex and landmarks.  Nose: Clear rhinorrhea, with some dried, crusty exudate adherent to mildly injected mucosa.  No purulent d/c.  No paranasal sinus TTP.  No facial swelling.  Throat and mouth without focal lesion.  No pharyngial swelling, erythema, or exudate.   Neck: supple, no LAD.   LUNGS: CTA bilat, nonlabored resps.  Lots of post-forced-exhalation coughing. CV: RRR, no m/r/g. EXT: no c/c/e   Pertinent labs:  none  ASSESSMENT AND PLAN:   New pt; obtain old records.  Acute  bronchitis No signif sign of RAD. Viral etiology suspected. Discussed options for symptomatic care and decided on most conservative approach since she is breast feeding still.  He infant can sleep through the night w/out needing a feeding. Recommended robitussin DM q6h prn. Saline nasal spray prn.  Signs/symptoms to call or return for were reviewed and pt expressed understanding.   An After Visit Summary was printed and given to the patient.  Return if symptoms worsen or fail to improve.

## 2013-10-28 NOTE — Progress Notes (Signed)
Pre visit review using our clinic review tool, if applicable. No additional management support is needed unless otherwise documented below in the visit note. 

## 2013-11-01 ENCOUNTER — Encounter: Payer: Self-pay | Admitting: Family Medicine

## 2013-11-01 DIAGNOSIS — J209 Acute bronchitis, unspecified: Secondary | ICD-10-CM | POA: Insufficient documentation

## 2013-11-01 NOTE — Assessment & Plan Note (Signed)
No signif sign of RAD. Viral etiology suspected. Discussed options for symptomatic care and decided on most conservative approach since she is breast feeding still.  He infant can sleep through the night w/out needing a feeding. Recommended robitussin DM q6h prn. Saline nasal spray prn.  Signs/symptoms to call or return for were reviewed and pt expressed understanding.

## 2013-11-10 ENCOUNTER — Encounter: Payer: Self-pay | Admitting: Gastroenterology

## 2013-12-25 ENCOUNTER — Encounter: Payer: Self-pay | Admitting: Family Medicine

## 2013-12-25 ENCOUNTER — Ambulatory Visit (INDEPENDENT_AMBULATORY_CARE_PROVIDER_SITE_OTHER): Payer: BC Managed Care – PPO | Admitting: Family Medicine

## 2013-12-25 VITALS — BP 122/86 | HR 85 | Temp 98.6°F | Resp 16 | Ht 70.0 in | Wt 180.0 lb

## 2013-12-25 DIAGNOSIS — J209 Acute bronchitis, unspecified: Secondary | ICD-10-CM

## 2013-12-25 DIAGNOSIS — Z23 Encounter for immunization: Secondary | ICD-10-CM

## 2013-12-25 DIAGNOSIS — R062 Wheezing: Secondary | ICD-10-CM | POA: Insufficient documentation

## 2013-12-25 MED ORDER — ALBUTEROL SULFATE HFA 108 (90 BASE) MCG/ACT IN AERS: 1.0000 | INHALATION_SPRAY | RESPIRATORY_TRACT | Status: DC | PRN

## 2013-12-25 MED ORDER — PREDNISONE 20 MG PO TABS: ORAL_TABLET | ORAL | Status: DC

## 2013-12-25 NOTE — Progress Notes (Signed)
OFFICE NOTE  12/25/2013  CC:  Chief Complaint  Patient presents with  . Cough  . Fever   HPI: Patient is a 38 y.o. Caucasian female who is here for resp complaints. Onset of cough 6 d/a, fever started the next day.  Fevers subsiding (most recent T> 100.5 was 3-4 days ago) but cough worsening.   Some runny nose last night.  Cough is mildly productive, lasts all day and even worse at night when lies supine. OTC cough meds no help.  No body aches.   Pertinent PMH:  Past medical, surgical, social, and family history reviewed and no changes are noted since last office visit.  MEDS: No anusol HC or ibuprofen  Outpatient Prescriptions Prior to Visit  Medication Sig Dispense Refill  . Betamethasone Valerate 0.12 % foam Apply 1 application topically daily as needed (for psoriasis).      Marland Kitchen. desoximetasone (TOPICORT) 0.05 % cream Apply 1 application topically daily.      . Prenat w/o A-FeCbGl-DSS-FA-DHA (CITRANATAL ASSURE) 35-1 & 300 MG tablet Take 1 tablet by mouth daily.        . Probiotic Product (PROBIOTIC PO) Take 1 capsule by mouth daily.      . valACYclovir (VALTREX) 500 MG tablet Take 500 mg by mouth 2 (two) times daily.      . hydrocortisone (ANUSOL-HC) 2.5 % rectal cream Place 1 application rectally daily as needed for hemorrhoids or itching.      Marland Kitchen. ibuprofen (ADVIL,MOTRIN) 600 MG tablet Take 1 tablet (600 mg total) by mouth every 6 (six) hours.  30 tablet  0   No facility-administered medications prior to visit.    PE: Blood pressure 122/86, pulse 85, temperature 98.6 F (37 C), temperature source Temporal, resp. rate 16, height 5\' 10"  (1.778 m), weight 180 lb (81.647 kg), SpO2 97.00%, currently breastfeeding. VS: noted--normal. Gen: alert, NAD, NONTOXIC APPEARING. HEENT: eyes without injection, drainage, or swelling.  Ears: EACs clear, TMs with normal light reflex and landmarks.  Nose: Clear rhinorrhea, with some dried, crusty exudate adherent to mildly injected mucosa.  No  purulent d/c.  No paranasal sinus TTP.  No facial swelling.  Throat and mouth without focal lesion.  No pharyngial swelling, erythema, or exudate.   Neck: supple, no LAD.   LUNGS: diffuse, coarse exp wheezing, with lots of post-exhalation coughing, nonlabored resps.  No adventitious sounds on inspiration.   CV: RRR, no m/r/g. EXT: no c/c/e SKIN: no rash   IMPRESSION AND PLAN:  Acute asthmatic bronchitis, suspect viral etiology. Prednisone 40mg  qd x 5d, ProAir 1-2 p q4h prn. OTC expectorant: continue. Inhaler education by CMA today.  An After Visit Summary was printed and given to the patient.  Flu vaccine IM today.  FOLLOW UP: prn

## 2013-12-25 NOTE — Progress Notes (Signed)
Pre visit review using our clinic review tool, if applicable. No additional management support is needed unless otherwise documented below in the visit note. 

## 2014-01-01 ENCOUNTER — Telehealth: Payer: Self-pay | Admitting: Family Medicine

## 2014-01-01 NOTE — Telephone Encounter (Signed)
Patient Information:  Caller Name: [name]  Phone: [phone]  Patient: Sharon Trujillo, Sharon Trujillo  Gender: Female  DOB: 09-Aug-1975  Age: 38 Years  PCP: Earley FavorMcGowen, [facility](Family Practice)  Pregnant: No  Office Follow Up:  Does the office need to follow up with this patient?: No  Instructions For The Office: N/A  RN Note:  Patient calling regarding recent diagnosis of viral bronchitis.  States "I am using medication and breathing a lot better.  Able to sleep threw the night, but today I have felt like I'm running a fever.  Currently working and unable to take temp."  All triage negative.  Agrees to rest, increase fluids and monitor temperature.  Encouraged to call prn.  Symptoms  Reason For Call & Symptoms: follow up viral bronchitis  Reviewed Health History In EMR: Yes  Reviewed Medications In EMR: Yes  Reviewed Allergies In EMR: Yes  Reviewed Surgeries / Procedures: Yes  Date of Onset of Symptoms: 01/01/2014  Treatments Tried: Ibuprophen  Treatments Tried Worked: Yes  Any Fever: Yes  Fever Taken: Tactile  Fever Time Of Reading: 15:04:29  Fever Last Reading: N/A OB / GYN:  LMP: Unknown  Guideline(s) Used:  Breathing Difficulty  Cough  Disposition Per Guideline:   See Within 3 Days in Office  Reason For Disposition Reached:   Cough has been present for > 10 days  Advice Given:  Reassurance  Coughing is the way that our lungs remove irritants and mucus. It helps protect our lungs from getting pneumonia.  Coughing Spasms:  Drink warm fluids. Inhale warm mist (Reason: both relax the airway and loosen up the phlegm).  Suck on cough drops or hard candy to coat the irritated throat.  Prevent Dehydration:  Drink adequate liquids.  This will help soothe an irritated or dry throat and loosen up the phlegm.  Expected Course:   Viral bronchitis (chest cold) causes a cough that lasts 1 to 3 weeks. Sometimes you may cough up lots of phlegm (sputum, mucus). The mucus can normally be  white, gray, yellow, or green.  Call Back If:  Difficulty breathing  Fever lasts > 3 days  You become worse.  Patient Will Follow Care Advice:  YES

## 2014-01-02 ENCOUNTER — Ambulatory Visit (INDEPENDENT_AMBULATORY_CARE_PROVIDER_SITE_OTHER): Payer: BC Managed Care – PPO | Admitting: Family Medicine

## 2014-01-02 ENCOUNTER — Encounter: Payer: Self-pay | Admitting: Family Medicine

## 2014-01-02 VITALS — BP 110/70 | HR 111 | Temp 99.5°F | Resp 17 | Wt 180.0 lb

## 2014-01-02 DIAGNOSIS — J209 Acute bronchitis, unspecified: Secondary | ICD-10-CM

## 2014-01-02 MED ORDER — BENZONATATE 200 MG PO CAPS: 200.0000 mg | ORAL_CAPSULE | Freq: Three times a day (TID) | ORAL | Status: DC | PRN

## 2014-01-02 MED ORDER — PROMETHAZINE-DM 6.25-15 MG/5ML PO SYRP: 5.0000 mL | ORAL_SOLUTION | Freq: Four times a day (QID) | ORAL | Status: DC | PRN

## 2014-01-02 MED ORDER — AZITHROMYCIN 250 MG PO TABS: ORAL_TABLET | ORAL | Status: DC

## 2014-01-02 NOTE — Progress Notes (Signed)
   Subjective:    Patient ID: Sharon Trujillo, female    DOB: January 22, 1976, 38 y.o.   MRN: [id number]  HPI URI- pt was seen by PCP on 10/16 and dx'd w/ bronchitis.  Initially had fevers but these had subsided by time she saw PCP and was started on Prednisone and Albuterol.  2 days ago fever returned.  'feeling miserable'.  Cough is deep and hacking, not as easily productive.  Increased tightness across chest.  Sputum has changed color to yellow.  No facial pain/pressure.  No hx of asthma.  + sick contacts.   Review of Systems For ROS see HPI     Objective:   Physical Exam  Vitals reviewed. Constitutional: She appears well-developed and well-nourished. No distress.  HENT:  Head: Normocephalic and atraumatic.  TMs normal bilaterally Mild nasal congestion Throat w/out erythema, edema, or exudate  Eyes: Conjunctivae and EOM are normal. Pupils are equal, round, and reactive to light.  Neck: Normal range of motion. Neck supple.  Cardiovascular: Normal rate, regular rhythm, normal heart sounds and intact distal pulses.   No murmur heard. Pulmonary/Chest: Effort normal and breath sounds normal. No respiratory distress. She has no wheezes.  + hacking cough  Lymphadenopathy:    She has no cervical adenopathy.          Assessment & Plan:

## 2014-01-02 NOTE — Assessment & Plan Note (Signed)
New.  Pt has been sick for >2 weeks, now w/ fevers and 2nd sickening.  Start abx to cover for bacterial illness.  Cough meds prn.  Reviewed breast feeding recommendations for promethazine dextromethorphan.  Reviewed supportive care and red flags that should prompt return.  Pt expressed understanding and is in agreement w/ plan.

## 2014-01-02 NOTE — Patient Instructions (Addendum)
Follow up as needed Start the Zpack as directed for bacterial infection Use the Tessalon for daytime cough Promethazine cough syrup after your nightly breast feed- should be out of your system prior to AM feed Drink plenty of fluids REST! Call with any questions or concerns Hang in there!!!

## 2014-01-02 NOTE — Progress Notes (Signed)
Pre visit review using our clinic review tool, if applicable. No additional management support is needed unless otherwise documented below in the visit note. 

## 2014-01-05 ENCOUNTER — Telehealth: Payer: Self-pay | Admitting: Nurse Practitioner

## 2014-01-05 ENCOUNTER — Encounter: Payer: Self-pay | Admitting: Nurse Practitioner

## 2014-01-05 ENCOUNTER — Ambulatory Visit (HOSPITAL_BASED_OUTPATIENT_CLINIC_OR_DEPARTMENT_OTHER)
Admission: RE | Admit: 2014-01-05 | Discharge: 2014-01-05 | Disposition: A | Discharge status: Home / Self Care | Payer: BC Managed Care – PPO | Source: Ambulatory Visit | Attending: Family Medicine | Admitting: Family Medicine

## 2014-01-05 ENCOUNTER — Telehealth: Payer: Self-pay | Admitting: Family Medicine

## 2014-01-05 ENCOUNTER — Ambulatory Visit (INDEPENDENT_AMBULATORY_CARE_PROVIDER_SITE_OTHER): Payer: BC Managed Care – PPO | Admitting: Nurse Practitioner

## 2014-01-05 ENCOUNTER — Other Ambulatory Visit: Payer: Self-pay | Admitting: Family Medicine

## 2014-01-05 VITALS — BP 104/71 | HR 95 | Temp 100.2°F | Ht 70.0 in | Wt 179.0 lb

## 2014-01-05 DIAGNOSIS — R059 Cough, unspecified: Secondary | ICD-10-CM

## 2014-01-05 DIAGNOSIS — R05 Cough: Secondary | ICD-10-CM | POA: Diagnosis not present

## 2014-01-05 DIAGNOSIS — R509 Fever, unspecified: Secondary | ICD-10-CM

## 2014-01-05 DIAGNOSIS — R0989 Other specified symptoms and signs involving the circulatory and respiratory systems: Secondary | ICD-10-CM | POA: Diagnosis not present

## 2014-01-05 DIAGNOSIS — J988 Other specified respiratory disorders: Secondary | ICD-10-CM

## 2014-01-05 MED ORDER — ALBUTEROL SULFATE HFA 108 (90 BASE) MCG/ACT IN AERS: 1.0000 | INHALATION_SPRAY | RESPIRATORY_TRACT | Status: DC | PRN

## 2014-01-05 MED ORDER — LEVOFLOXACIN 750 MG PO TABS: 750.0000 mg | ORAL_TABLET | Freq: Every day | ORAL | Status: DC

## 2014-01-05 NOTE — Patient Instructions (Signed)
Increase inhaler use to 3 times daily, followed by deep breaths & cough.  Start levaquin. Stop azithromycin. Eat yogurt daily to help prevent antibiotic -associated diarrhea.  Start mucinex to help break up congestion-don't take before bed.  Follow up in 7 to 10 days.

## 2014-01-05 NOTE — Telephone Encounter (Signed)
CXR shows LL bronchopneumonia. Sytarted levaquin. Waiting for CBC & CMET. Discussed w/pt. See phone note.

## 2014-01-05 NOTE — Progress Notes (Signed)
Pre visit review using our clinic review tool, if applicable. No additional management support is needed unless otherwise documented below in the visit note. 

## 2014-01-05 NOTE — Telephone Encounter (Signed)
Patient Information:  Caller Name: [name]  Phone: [phone]  Patient: Sharon Trujillo, Sharon Trujillo  Gender: Female  DOB: 07-04-1975  Age: 38 Years  PCP: Earley FavorMcGowen, [facility](Family Practice)  Pregnant: No  Office Follow Up:  Does the office need to follow up with this patient?: No  Instructions For The Office: N/A  RN Note:  Appt scheduled today 01/05/14 with Maximino SarinLayne Weaver NP.  Care advice provided.  Symptoms  Reason For Call & Symptoms: Patient states that she has been seen by Dr. Marvel PlanMcGowan and Dr. Beverely Lowabori recently.  Dx Acute Bronchitis.  10 /16 and 10/24.  She was given Zpack and promthazine syrup and Tessalon pearls . She is on Day 4 of Azithromycin and states she is no better. Cough medication is not working. She states she is not worse from 24th but is not better. Temperature 100.2 (o). +cough productive white -green. tired.  Reviewed Health History In EMR: Yes  Reviewed Medications In EMR: Yes  Reviewed Allergies In EMR: Yes  Reviewed Surgeries / Procedures: Yes  Date of Onset of Symptoms: 12/25/2013  Treatments Tried: Zithromax, Tessalon, Promethazine,  alubterol prn  Treatments Tried Worked: No  Any Fever: Yes  Fever Taken: Oral  Fever Time Of Reading: 11:00:00  Fever Last Reading: 100.2 OB / GYN:  LMP: 01/05/2014  Guideline(s) Used:  Cough  Disposition Per Guideline:   See Today in Office  Reason For Disposition Reached:   Fever present > 3 days (72 hours)  Advice Given:  Cough Medicines:  Home Remedy - Hard Candy: Hard candy works just as well as medicine-flavored OTC cough drops. Diabetics should use sugar-free candy.  Home Remedy - Honey: This old home remedy has been shown to help decrease coughing at night. The adult dosage is 2 teaspoons (10 ml) at bedtime. Honey should not be given to infants under one year of age.  Coughing Spasms:  Drink warm fluids. Inhale warm mist (Reason: both relax the airway and loosen up the phlegm).  Suck on cough drops or hard candy to coat  the irritated throat.  Prevent Dehydration:  Drink adequate liquids.  This will help soothe an irritated or dry throat and loosen up the phlegm.  Avoid Tobacco Smoke:  Smoking or being exposed to smoke makes coughs much worse.  Call Back If:  Difficulty breathing  Fever lasts > 3 days  You become worse.  Patient Will Follow Care Advice:  YES  Appointment Scheduled:  01/05/2014 15:00:00 Appointment Scheduled Provider:  Maximino SarinWeaver, Layne

## 2014-01-06 ENCOUNTER — Telehealth: Payer: Self-pay | Admitting: Nurse Practitioner

## 2014-01-06 DIAGNOSIS — R059 Cough, unspecified: Secondary | ICD-10-CM | POA: Insufficient documentation

## 2014-01-06 DIAGNOSIS — R509 Fever, unspecified: Secondary | ICD-10-CM | POA: Insufficient documentation

## 2014-01-06 DIAGNOSIS — J988 Other specified respiratory disorders: Secondary | ICD-10-CM | POA: Insufficient documentation

## 2014-01-06 DIAGNOSIS — R05 Cough: Secondary | ICD-10-CM | POA: Insufficient documentation

## 2014-01-06 LAB — CBC WITH DIFFERENTIAL/PLATELET
BASOS PCT: 0.5 % (ref 0.0–3.0)
Basophils Absolute: 0 10*3/uL (ref 0.0–0.1)
Eosinophils Absolute: 0.1 10*3/uL (ref 0.0–0.7)
Eosinophils Relative: 1.9 % (ref 0.0–5.0)
HCT: 37.5 % (ref 36.0–46.0)
HEMOGLOBIN: 12.4 g/dL (ref 12.0–15.0)
Lymphocytes Relative: 15.6 % (ref 12.0–46.0)
Lymphs Abs: 0.6 10*3/uL — ABNORMAL LOW (ref 0.7–4.0)
MCHC: 33.2 g/dL (ref 30.0–36.0)
MCV: 92.9 fl (ref 78.0–100.0)
MONOS PCT: 2.3 % — AB (ref 3.0–12.0)
Monocytes Absolute: 0.1 10*3/uL (ref 0.1–1.0)
Neutro Abs: 3.2 10*3/uL (ref 1.4–7.7)
Neutrophils Relative %: 79.7 % — ABNORMAL HIGH (ref 43.0–77.0)
Platelets: 280 10*3/uL (ref 150.0–400.0)
RBC: 4.03 Mil/uL (ref 3.87–5.11)
RDW: 12.6 % (ref 11.5–15.5)
WBC: 4 10*3/uL (ref 4.0–10.5)

## 2014-01-06 LAB — COMPREHENSIVE METABOLIC PANEL
ALT: 17 U/L (ref 0–35)
AST: 15 U/L (ref 0–37)
Albumin: 3.2 g/dL — ABNORMAL LOW (ref 3.5–5.2)
Alkaline Phosphatase: 62 U/L (ref 39–117)
BUN: 8 mg/dL (ref 6–23)
CALCIUM: 9 mg/dL (ref 8.4–10.5)
CHLORIDE: 104 meq/L (ref 96–112)
CO2: 27 meq/L (ref 19–32)
CREATININE: 0.6 mg/dL (ref 0.4–1.2)
GFR: 121.93 mL/min (ref >60.00)
Glucose, Bld: 157 mg/dL — ABNORMAL HIGH (ref 70–99)
Potassium: 4.3 mEq/L (ref 3.5–5.1)
Sodium: 136 mEq/L (ref 135–145)
Total Bilirubin: 0.7 mg/dL (ref 0.2–1.2)
Total Protein: 6 g/dL (ref 6.0–8.3)

## 2014-01-06 NOTE — Telephone Encounter (Signed)
pls call pt: Advise WHite cells are not elevated- looks like a viral pneumonia. She should continue with instructions given in ofc.

## 2014-01-06 NOTE — Progress Notes (Signed)
Subjective:     Sharon Trujillo is a 38 y.o. female who presents for evaluation of HA, fever, persistent cough, & fatigue. Pt was seen 10/16 with 1 weeks low grade fever & cough. She was treated w/prednisone & albuterol inhaler & had some improvement in cough & resolved fever, until 1 week ago when low grade fever returned. She was seen by another provider 3 da & started on azithromycin & prescribed benzonatate & cough syrup. She is concerned that cough & fatigue are getting worse in spite of change in treatment. She had CXRY before coming to ofc today, but I do not have results yet. She is breast feeding, but willing to stop if prescribed meds are not compatible.   The following portions of the patient's history were reviewed and updated as appropriate: allergies, current medications, past family history, past medical history, past social history, past surgical history and problem list.  Review of Systems Pertinent items are noted in HPI.    Objective:    BP 104/71  Pulse 95  Temp(Src) 100.2 F (37.9 C) (Oral)  Ht 5\' 10"  (1.778 m)  Wt 179 lb (81.194 kg)  BMI 25.68 kg/m2  SpO2 100%  LMP 01/05/2014 General appearance: alert, cooperative, appears stated age, fatigued, mild distress and frequent coughing during exam Head: Normocephalic, without obvious abnormality, atraumatic Eyes: negative findings: lids and lashes normal and conjunctivae and sclerae normal Ears: normal TM and external ear canal right ear and abnormal TM left ear - TM vessels injected o/w nml, vessels injected, bones visible  Nose: Nares normal. Septum midline. Mucosa normal. No drainage or sinus tenderness. Throat: lips, mucosa, and tongue normal; teeth and gums normal Lungs: clear to auscultation bilaterally Heart: regular rate and rhythm, S1, S2 normal, no murmur, click, rub or gallop Lymph nodes: Cervical, supraclavicular, and axillary nodes normal.    Assessment:  1. Cough Given recurrent fever, likely pneumonia -  CBC with Differential - Comprehensive metabolic panel  2. Fever, unspecified fever cause - CBC with Differential - Comprehensive metabolic panel  3. Respiratory infection - levofloxacin (LEVAQUIN) 750 MG tablet; Take 1 tablet (750 mg total) by mouth daily.  Dispense: 7 tablet; Refill: 0 - albuterol (PROAIR HFA) 108 (90 BASE) MCG/ACT inhaler; Inhale 1-2 puffs into the lungs every 4 (four) hours as needed for wheezing or shortness of breath.  Dispense: 1 Inhaler; Refill: 1  See pt instructions. F/u 2 wks or sooner PRN

## 2014-01-07 NOTE — Telephone Encounter (Signed)
Caller: Sharon Trujillo/Patient; Phone: [phone]; Reason for Call: Pt was returning call she missed from office earlier today. Triage RN reviewed phone call notes in EMR and relayed the message to the pt--which was the answer to pts question earlier today---info provided to pt was exactly as written below by L. Alben SpittleWeaver NP. Pt verbalized understanding and denied any further questions/concerns.

## 2014-01-07 NOTE — Telephone Encounter (Signed)
Patient notified of results.Patient was wondering if this infection is a new virus or if infection has progressed from the bronchitis that pt original had? Patient is concerned that she may have passed virus to her daughter. Please advise?

## 2014-01-07 NOTE — Telephone Encounter (Signed)
LMOVM for pt to return call 

## 2014-01-07 NOTE — Telephone Encounter (Signed)
It is likely that pneumonia is secondary to initial virus. There is potential to shed virus as long as she has fever. Pneumonia is not contagious, only virus & it will not necessarily cause same symptoms.

## 2014-01-08 NOTE — Telephone Encounter (Signed)
Note:

## 2014-01-11 ENCOUNTER — Encounter: Payer: Self-pay | Admitting: Nurse Practitioner

## 2014-01-15 ENCOUNTER — Ambulatory Visit (INDEPENDENT_AMBULATORY_CARE_PROVIDER_SITE_OTHER): Payer: BC Managed Care – PPO | Admitting: Family Medicine

## 2014-01-15 ENCOUNTER — Encounter: Payer: Self-pay | Admitting: Family Medicine

## 2014-01-15 VITALS — BP 124/77 | HR 92 | Temp 98.4°F | Resp 18 | Ht 70.0 in | Wt 179.0 lb

## 2014-01-15 DIAGNOSIS — J189 Pneumonia, unspecified organism: Secondary | ICD-10-CM

## 2014-01-15 NOTE — Progress Notes (Signed)
OFFICE VISIT  01/15/2014   CC:  Chief Complaint  Patient presents with  . Follow-up   HPI:    Patient is a 38 y.o. Caucasian female who presents for 1 week f/u respiratory illness. On 01/05/14 a CXR showed a streaky left lower lobe infiltrate with streaky right upper lobe scarring or atelectasis.  She was changed from azithromycin to levaquin at that time. Feeling much improved: 80-90 % improved regarding cough/chest/energy level.  No fevers.  No longer requiring cough med or albuterol.  Says she felt like the albuterol HFA led to rebound bronchospasm: helped chest tightness and wheezing but at 3 hr after dosing she felt acute signif worsening of chest tightness, some nausea, some HA.  She then stopped albuterol and this effect stopped.   Past Medical History  Diagnosis Date  . HSV (herpes simplex virus) anogenital infection   . Gestational hypertension without significant proteinuria in third trimester 11/09/2010  . Psoriasis   . GERD (gastroesophageal reflux disease)     pregnancy only  . Palpitations     stress related palpitations  . Lactose intolerance   . Psoriasis     occ left knee pain WITHOUT swelling--resolves with use of CLARITIN (??.)  No hx suspicious for psoriatic arthritis.    Past Surgical History  Procedure Laterality Date  . Leep  2005  . Femoral hernia repair  2009    left  . Cesarean section  11/09/2010    Procedure: CESAREAN SECTION;  Surgeon: Lenoard Adenichard J Taavon, MD;  Location: [facility];  Service: Gynecology;  Laterality: N/A;  Primary Cesarean Section with birth of baby boy @ 2316  . Dilation and evacuation  01/16/2012    Procedure: DILATATION AND EVACUATION;  Surgeon: Lenoard Adenichard J Taavon, MD;  Location: [facility];  Service: Gynecology;  Laterality: N/A;  . Cesarean section N/A 01/23/2013    Procedure: CESAREAN SECTION;  Surgeon: Lenoard Adenichard J Taavon, MD;  Location: [facility];  Service: Obstetrics;  Laterality: N/A;  . Colonoscopy  04/2008    done for hematochezia  (normal).  Needs screening TCS age 38    Outpatient Prescriptions Prior to Visit  Medication Sig Dispense Refill  . Betamethasone Valerate 0.12 % foam Apply 1 application topically daily as needed (for psoriasis).    Marland Kitchen. desoximetasone (TOPICORT) 0.05 % cream Apply 1 application topically daily.    . hydrocortisone (ANUSOL-HC) 2.5 % rectal cream Place 1 application rectally daily as needed for hemorrhoids or itching.    Marland Kitchen. ibuprofen (ADVIL,MOTRIN) 600 MG tablet Take 1 tablet (600 mg total) by mouth every 6 (six) hours. 30 tablet 0  . Prenat w/o A-FeCbGl-DSS-FA-DHA (CITRANATAL ASSURE) 35-1 & 300 MG tablet Take 1 tablet by mouth daily.      Marland Kitchen. albuterol (PROAIR HFA) 108 (90 BASE) MCG/ACT inhaler Inhale 1-2 puffs into the lungs every 4 (four) hours as needed for wheezing or shortness of breath. 1 Inhaler 1  . valACYclovir (VALTREX) 500 MG tablet Take 500 mg by mouth 2 (two) times daily.    . benzonatate (TESSALON) 200 MG capsule Take 1 capsule (200 mg total) by mouth 3 (three) times daily as needed for cough. 60 capsule 0  . levofloxacin (LEVAQUIN) 750 MG tablet Take 1 tablet (750 mg total) by mouth daily. 7 tablet 0  . promethazine-dextromethorphan (PROMETHAZINE-DM) 6.25-15 MG/5ML syrup Take 5 mLs by mouth 4 (four) times daily as needed. 240 mL 0   No facility-administered medications prior to visit.    Allergies  Allergen Reactions  .  Codeine Itching and Nausea And Vomiting    Can tolerate Tramadol  . Penicillins Hives and Swelling  . Sulfa Antibiotics Swelling  . Trimethoprim Swelling    ROS As per HPI  PE: Blood pressure 124/77, pulse 92, temperature 98.4 F (36.9 C), temperature source Temporal, resp. rate 18, height 5\' 10"  (1.778 m), weight 179 lb (81.194 kg), last menstrual period 01/05/2014, SpO2 99 %, currently breastfeeding. Gen: Alert, well appearing.  Patient is oriented to person, place, time, and situation. CV: RRR, no m/r/g.   LUNGS: CTA bilat, nonlabored resps, good  aeration in all lung fields.  LABS:  none  IMPRESSION AND PLAN:  CAP (community acquired pneumonia) Resolving appropriately: 80-90% clinically improved. No further med needed. No repeat of CXR is indicated. Will note adverse rxn to albuterol in chart.   An After Visit Summary was printed and given to the patient.  FOLLOW UP: Return if symptoms worsen or fail to improve.

## 2014-01-15 NOTE — Assessment & Plan Note (Signed)
Resolving appropriately: 80-90% clinically improved. No further med needed. No repeat of CXR is indicated. Will note adverse rxn to albuterol in chart.

## 2014-02-09 ENCOUNTER — Telehealth: Payer: Self-pay | Admitting: Nurse Practitioner

## 2014-02-09 ENCOUNTER — Telehealth: Payer: Self-pay | Admitting: Family Medicine

## 2014-02-09 ENCOUNTER — Ambulatory Visit (HOSPITAL_BASED_OUTPATIENT_CLINIC_OR_DEPARTMENT_OTHER)
Admission: RE | Admit: 2014-02-09 | Discharge: 2014-02-09 | Disposition: A | Discharge status: Home / Self Care | Payer: BC Managed Care – PPO | Source: Ambulatory Visit | Attending: Nurse Practitioner | Admitting: Nurse Practitioner

## 2014-02-09 ENCOUNTER — Encounter: Payer: Self-pay | Admitting: Nurse Practitioner

## 2014-02-09 ENCOUNTER — Ambulatory Visit (INDEPENDENT_AMBULATORY_CARE_PROVIDER_SITE_OTHER): Payer: BC Managed Care – PPO | Admitting: Nurse Practitioner

## 2014-02-09 VITALS — BP 124/78 | HR 67 | Temp 98.1°F | Resp 18 | Ht 70.0 in | Wt 178.0 lb

## 2014-02-09 DIAGNOSIS — R059 Cough, unspecified: Secondary | ICD-10-CM

## 2014-02-09 DIAGNOSIS — R9389 Abnormal findings on diagnostic imaging of other specified body structures: Secondary | ICD-10-CM

## 2014-02-09 DIAGNOSIS — R05 Cough: Secondary | ICD-10-CM | POA: Diagnosis not present

## 2014-02-09 DIAGNOSIS — R42 Dizziness and giddiness: Secondary | ICD-10-CM

## 2014-02-09 DIAGNOSIS — R112 Nausea with vomiting, unspecified: Secondary | ICD-10-CM

## 2014-02-09 MED ORDER — ONDANSETRON 8 MG PO TBDP: 8.0000 mg | ORAL_TABLET | Freq: Two times a day (BID) | ORAL | Status: DC | PRN

## 2014-02-09 MED ORDER — MECLIZINE HCL 25 MG PO TABS: ORAL_TABLET | ORAL | Status: DC

## 2014-02-09 NOTE — Progress Notes (Signed)
Pre visit review using our clinic review tool, if applicable. No additional management support is needed unless otherwise documented below in the visit note. 

## 2014-02-09 NOTE — Telephone Encounter (Signed)
pls call pt: Advise CXR shows left lobe pneumonia has cleared, but there is a density in R Upper lobe that isn't clear-it may be as simple as overlying structures, but Radiologist suggests an apical lordotic view. I will order & she can get done at her convenience.

## 2014-02-09 NOTE — Progress Notes (Signed)
Subjective:     Deberah Castlelaine M Hancock is a 38 y.o. female who presents for evaluation of dizziness. The symptoms started 2 days ago and are waxing & waning. The attacks occur mostly at night when rolls over in bed, but feels dizzy today  Positions that worsen symptoms: lying down. Previous workup/treatments: none. Associated ear symptoms: aural pressure. Associated CNS symptoms: none. Recent infections: pneumonia 4 weeks ago-got better, recently developed cough again. Head trauma: denied. Drug ingestion: none. Noise exposure: no occupational exposure.   The following portions of the patient's history were reviewed and updated as appropriate: allergies, current medications, past medical history, past social history, past surgical history and problem list.  Review of Systems Constitutional: negative for fatigue, fevers and night sweats Eyes: negative for visual disturbance Ears, nose, mouth, throat, and face: positive for nasal congestion, negative for sore throat Respiratory: positive for cough and sputum, negative for pleurisy/chest pain and wheezing Gastrointestinal: positive for nausea and vomit times 1, negative for abdominal pain and diarrhea Musculoskeletal:positive for recurrent L knee pain relieved by claritin Neurological: negative for headaches    Objective:    BP 124/78 mmHg  Pulse 67  Temp(Src) 98.1 F (36.7 C) (Oral)  Resp 18  Ht 5\' 10"  (1.778 m)  Wt 178 lb (80.74 kg)  BMI 25.54 kg/m2  SpO2 99%  LMP 01/26/2014 General appearance: alert, cooperative, appears stated age and no distress Head: Normocephalic, without obvious abnormality, atraumatic Eyes: negative findings: lids and lashes normal, conjunctivae and sclerae normal, corneas clear and pupils equal, round, reactive to light and accomodation Ears: normal TM's and external ear canals both ears Throat: lips, mucosa, and tongue normal; teeth and gums normal Lungs: clear to auscultation bilaterally Heart: regular rate and  rhythm, S1, S2 normal, no murmur, click, rub or gallop Lymph nodes: no cervical or [place] LAD Neurologic: Alert and oriented X 3, normal strength and tone. Normal symmetric reflexes. Normal coordination and gait      Assessment:Plan   1. Cough Treated for pneumonia 4 weeks ago - DG Chest 2 View; Future  2. Dizzy DD: BPPV, post-viral neuritis - POCT Pregnancy, Urine; Standing-neg - meclizine (ANTIVERT) 25 MG tablet; May take 1 to 2 tabs BID PRN dizzy  Dispense: 30 tablet; Refill: 0 epleys maneuver, if no improvement, send to PT  3. vomiting with nausea, vomiting of unspecified type - ondansetron (ZOFRAN-ODT) 8 MG disintegrating tablet; Take 1 tablet (8 mg total) by mouth 2 (two) times daily as needed for nausea or vomiting.  Dispense: 20 tablet; Refill: 0 F/u PRN CXR

## 2014-02-09 NOTE — Telephone Encounter (Signed)
Spoke with pt, advised message from Layne. Pt understood. 

## 2014-02-09 NOTE — Telephone Encounter (Signed)
Patient Information:  Caller Name: [name]  Phone: [phone]  Patient: Sharon Trujillo, Sharon Trujillo  Gender: Female  DOB: April 15, 1975  Age: 38 Years  PCP: Earley FavorMcGowen, [facility])  Pregnant: No  Office Follow Up:  Does the office need to follow up with this patient?: No  Instructions For The Office: N/A   Symptoms  Reason For Call & Symptoms: Pt has been dizzy since yesterday. Pt has been congested mildly x 1 week. Pt vomited x 1 since she was so dizzy. As soon as she moves/or moves her head she is dizzy.  Reviewed Health History In EMR: Yes  Reviewed Medications In EMR: Yes  Reviewed Allergies In EMR: Yes  Reviewed Surgeries / Procedures: Yes  Date of Onset of Symptoms: 02/08/2014 OB / GYN:  LMP: 01/27/2014  Guideline(s) Used:  Dizziness  Disposition Per Guideline:   Go to Office Now  Reason For Disposition Reached:   Lightheadedness (dizziness) present now, after 2 hours of rest and fluids  Advice Given:  N/A  Patient Will Follow Care Advice:  YES  Appointment Scheduled:  02/09/2014 11:15:00 Appointment Scheduled Provider:  Maximino SarinWeaver, Layne

## 2014-02-09 NOTE — Patient Instructions (Signed)
Perform Epley's maneuver at home 3-4 times, if no resolve in symptoms, please call & I will refer to PT. You may try meclizine. You may take zofran for nausea.  I am concerned about cough. Please get chest xray. My office will call with results and follow up. In the meantime, use albuterol inhaler once or twice daily to help break up mucous. Take mucinex 600 mg twice daily for several days. Stay hydrated-sip every hour.  Benign Positional Vertigo Vertigo means you feel like you or your surroundings are moving when they are not. Benign positional vertigo is the most common form of vertigo. Benign means that the cause of your condition is not serious. Benign positional vertigo is more common in older adults. CAUSES  Benign positional vertigo is the result of an upset in the labyrinth system. This is an area in the middle ear that helps control your balance. This may be caused by a viral infection, head injury, or repetitive motion. However, often no specific cause is found. SYMPTOMS  Symptoms of benign positional vertigo occur when you move your head or eyes in different directions. Some of the symptoms may include:  Loss of balance and falls.  Vomiting.  Blurred vision.  Dizziness.  Nausea.  Involuntary eye movements (nystagmus). DIAGNOSIS  Benign positional vertigo is usually diagnosed by physical exam. If the specific cause of your benign positional vertigo is unknown, your caregiver may perform imaging tests, such as magnetic resonance imaging (MRI) or computed tomography (CT). TREATMENT  Your caregiver may recommend movements or procedures to correct the benign positional vertigo. Medicines such as meclizine, benzodiazepines, and medicines for nausea may be used to treat your symptoms. In rare cases, if your symptoms are caused by certain conditions that affect the inner ear, you may need surgery. HOME CARE INSTRUCTIONS   Follow your caregiver's instructions.  Move slowly. Do not make  sudden body or head movements.  Avoid driving.  Avoid operating heavy machinery.  Avoid performing any tasks that would be dangerous to you or others during a vertigo episode.  Drink enough fluids to keep your urine clear or pale yellow. SEEK IMMEDIATE MEDICAL CARE IF:   You develop problems with walking, weakness, numbness, or using your arms, hands, or legs.  You have difficulty speaking.  You develop severe headaches.  Your nausea or vomiting continues or gets worse.  You develop visual changes.  Your family or friends notice any behavioral changes.  Your condition gets worse.  You have a fever.  You develop a stiff neck or sensitivity to light. MAKE SURE YOU:   Understand these instructions.  Will watch your condition.  Will get help right away if you are not doing well or get worse. Document Released: 12/04/2005 Document Revised: 05/21/2011 Document Reviewed: 11/16/2010 [facility] Patient Information 2015 [employer] [place]. This information is not intended to replace advice given to you by your health care provider. Make sure you discuss any questions you have with your health care provider.

## 2014-02-10 ENCOUNTER — Telehealth: Payer: Self-pay | Admitting: Nurse Practitioner

## 2014-02-10 NOTE — Telephone Encounter (Signed)
Spoke with pt, advised message from Layne. Pt understood. 

## 2014-02-10 NOTE — Telephone Encounter (Signed)
pls call pt: Advise Great news-no pneumonia & no suspicious lesion. Area of concern is prominent costochondral cartilege. She may want to take mucinex for few days, sip fluids every hour. Let us know if fever or chest pain, persistent dizziness.

## 2014-04-26 ENCOUNTER — Ambulatory Visit: Payer: Self-pay | Admitting: Family Medicine

## 2014-05-03 ENCOUNTER — Encounter: Payer: Self-pay | Admitting: Family Medicine

## 2014-05-03 ENCOUNTER — Ambulatory Visit (INDEPENDENT_AMBULATORY_CARE_PROVIDER_SITE_OTHER): Payer: 59 | Admitting: Family Medicine

## 2014-05-03 VITALS — BP 130/85 | HR 65 | Temp 98.0°F | Resp 18 | Ht 70.0 in | Wt 179.0 lb

## 2014-05-03 DIAGNOSIS — L130 Dermatitis herpetiformis: Secondary | ICD-10-CM

## 2014-05-03 DIAGNOSIS — K900 Celiac disease: Secondary | ICD-10-CM | POA: Diagnosis not present

## 2014-05-03 LAB — COMPREHENSIVE METABOLIC PANEL
ALBUMIN: 4.6 g/dL (ref 3.5–5.2)
ALK PHOS: 57 U/L (ref 39–117)
ALT: 13 U/L (ref 0–35)
AST: 13 U/L (ref 0–37)
BUN: 12 mg/dL (ref 6–23)
CHLORIDE: 106 meq/L (ref 96–112)
CO2: 27 mEq/L (ref 19–32)
Calcium: 9.9 mg/dL (ref 8.4–10.5)
Creatinine, Ser: 0.68 mg/dL (ref 0.40–1.20)
GFR: 103.32 mL/min (ref >60.00)
Glucose, Bld: 93 mg/dL (ref 70–99)
POTASSIUM: 4.2 meq/L (ref 3.5–5.1)
Sodium: 140 mEq/L (ref 135–145)
TOTAL PROTEIN: 6.7 g/dL (ref 6.0–8.3)
Total Bilirubin: 0.9 mg/dL (ref 0.2–1.2)

## 2014-05-03 LAB — VITAMIN D 25 HYDROXY (VIT D DEFICIENCY, FRACTURES): VITD: 24.01 ng/mL — ABNORMAL LOW (ref 30.00–100.00)

## 2014-05-03 LAB — PROTIME-INR
INR: 1 ratio (ref 0.8–1.0)
Prothrombin Time: 10.7 s (ref 9.6–13.1)

## 2014-05-03 LAB — IBC PANEL
Iron: 95 ug/dL (ref 42–145)
Saturation Ratios: 28.8 % (ref 20.0–50.0)
TRANSFERRIN: 236 mg/dL (ref 212.0–360.0)

## 2014-05-03 LAB — IGA: IgA: 160 mg/dL (ref 68–378)

## 2014-05-03 LAB — FERRITIN: Ferritin: 36.5 ng/mL (ref 10.0–291.0)

## 2014-05-03 NOTE — Progress Notes (Signed)
OFFICE NOTE  05/03/2014  CC:  Chief Complaint  Patient presents with  . Celiac Disease    pt would like to be tested   HPI: Patient is a 39 y.o. Caucasian female who is here for concern about possible celiac disease. She notes worsening pruritic papulovesicular rash on arms, low back, backs of thighs.  She tried gluten-free diet for about 1 week and she stopped her topical steroids, and she noted a significant improvement in her rash. [facility] but not in the last couple of years.  Hx of erythema nodosum in the past, says she gets one on occasion when she gets stressed. She denies any abdominal complaints.  No nausea, no diarrhea or melena/hematochezia. However, she does say that she gets very mild nausea about 20 min after eating a high gluten-content meal.  Pertinent PMH:  Past medical, surgical, social, and family history reviewed and no changes are noted since last office visit.  MEDS:  Outpatient Prescriptions Prior to Visit  Medication Sig Dispense Refill  . Betamethasone Valerate 0.12 % foam Apply 1 application topically daily as needed (for psoriasis).    Marland Kitchen. desoximetasone (TOPICORT) 0.05 % cream Apply 1 application topically daily.    . hydrocortisone (ANUSOL-HC) 2.5 % rectal cream Place 1 application rectally daily as needed for hemorrhoids or itching.    . valACYclovir (VALTREX) 500 MG tablet Take 500 mg by mouth 2 (two) times daily.    Marland Kitchen. albuterol (PROAIR HFA) 108 (90 BASE) MCG/ACT inhaler Inhale 1-2 puffs into the lungs every 4 (four) hours as needed for wheezing or shortness of breath. (Patient not taking: Reported on 05/03/2014) 1 Inhaler 1  . ibuprofen (ADVIL,MOTRIN) 600 MG tablet Take 1 tablet (600 mg total) by mouth every 6 (six) hours. (Patient not taking: Reported on 05/03/2014) 30 tablet 0  . meclizine (ANTIVERT) 25 MG tablet May take 1 to 2 tabs BID PRN dizzy (Patient not taking: Reported on 05/03/2014) 30 tablet 0  . ondansetron  (ZOFRAN-ODT) 8 MG disintegrating tablet Take 1 tablet (8 mg total) by mouth 2 (two) times daily as needed for nausea or vomiting. 20 tablet 0  . Prenat w/o A-FeCbGl-DSS-FA-DHA (CITRANATAL ASSURE) 35-1 & 300 MG tablet Take 1 tablet by mouth daily.       No facility-administered medications prior to visit.    PE: Blood pressure 130/85, pulse 65, temperature 98 F (36.7 C), temperature source Temporal, resp. rate 18, height 5\' 10"  (1.778 m), weight 179 lb (81.194 kg), SpO2 98 %, currently breastfeeding. Gen: Alert, well appearing.  Patient is oriented to person, place, time, and situation. ZOX:WRUEENT:Eyes: no injection, icteris, swelling, or exudate.  EOMI, PERRLA. Mouth: lips without lesion/swelling.  Oral mucosa pink and moist. Oropharynx without erythema, exudate, or swelling.  CV: RRR, no m/r/g.   LUNGS: CTA bilat, nonlabored resps, good aeration in all lung fields. ABD: soft, NT, ND, BS normal.  No hepatospenomegaly or mass.  No bruits. EXT: no clubbing, cyanosis, or edema.  SKIN: scattered papulovesicular (the vesicles are all opened/dried) lesions on mid low back, on lower legs and arms, also on thighs.  Left pretibial region with 2 cm tendern subQ nodular lesion.  Two half-dollar sized pinkish, blanching macular lesions on inner aspect of each calf--symmetric appearing.    IMPRESSION AND PLAN:  1) Rash suspicious for dermatitis herpetiformis, with recent trial of gluten free diet resulting in marked improvement in the rash.  Raises question of celiac disease. Will check IgA  total, TTG and Gliadin IgA levels, CMET, 25-OH vit D level, and PT/INR.  2) Question of erythema nodosum lesion on left pretibial region.  3) Psoriasis: she has minimal dz at this time: just a few lesions on tops of hands and behind ears.  She may need to get back with her dermatologist, Dr. Donzetta Starch, if our testing does not come out positive for celiac disease.  An After Visit Summary was printed and given to the  patient.  FOLLOW UP: To be determined based on results of pending workup.

## 2014-05-03 NOTE — Progress Notes (Signed)
Pre visit review using our clinic review tool, if applicable. No additional management support is needed unless otherwise documented below in the visit note. 

## 2014-05-04 ENCOUNTER — Encounter: Payer: Self-pay | Admitting: Family Medicine

## 2014-05-04 LAB — GLIADIN IGA+TTG IGA: Transglutaminase IgA: <2 U/mL (ref 0–3)

## 2014-05-05 ENCOUNTER — Other Ambulatory Visit: Payer: Self-pay | Admitting: Family Medicine

## 2014-05-05 MED ORDER — VITAMIN D (ERGOCALCIFEROL) 1.25 MG (50000 UNIT) PO CAPS: 50000.0000 [IU] | ORAL_CAPSULE | ORAL | Status: DC

## 2014-05-06 ENCOUNTER — Encounter: Payer: Self-pay | Admitting: Family Medicine

## 2014-05-07 ENCOUNTER — Other Ambulatory Visit: Payer: Self-pay | Admitting: Dermatology

## 2014-12-31 ENCOUNTER — Other Ambulatory Visit: Payer: 59

## 2015-02-07 ENCOUNTER — Ambulatory Visit (INDEPENDENT_AMBULATORY_CARE_PROVIDER_SITE_OTHER): Payer: 59 | Admitting: Family Medicine

## 2015-02-07 ENCOUNTER — Encounter: Payer: Self-pay | Admitting: Family Medicine

## 2015-02-07 VITALS — BP 123/84 | HR 74 | Temp 99.1°F | Resp 16 | Ht 70.0 in | Wt 176.0 lb

## 2015-02-07 DIAGNOSIS — M25562 Pain in left knee: Secondary | ICD-10-CM | POA: Diagnosis not present

## 2015-02-07 DIAGNOSIS — R5383 Other fatigue: Secondary | ICD-10-CM | POA: Diagnosis not present

## 2015-02-07 DIAGNOSIS — T148 Other injury of unspecified body region: Secondary | ICD-10-CM

## 2015-02-07 DIAGNOSIS — M25462 Effusion, left knee: Secondary | ICD-10-CM | POA: Diagnosis not present

## 2015-02-07 DIAGNOSIS — W57XXXA Bitten or stung by nonvenomous insect and other nonvenomous arthropods, initial encounter: Secondary | ICD-10-CM

## 2015-02-07 DIAGNOSIS — Z203 Contact with and (suspected) exposure to rabies: Secondary | ICD-10-CM

## 2015-02-07 LAB — CBC WITH DIFFERENTIAL/PLATELET
BASOS ABS: 0 10*3/uL (ref 0.0–0.1)
BASOS PCT: 0.2 % (ref 0.0–3.0)
EOS ABS: 0.3 10*3/uL (ref 0.0–0.7)
Eosinophils Relative: 3.5 % (ref 0.0–5.0)
HCT: 40.2 % (ref 36.0–46.0)
Hemoglobin: 13.3 g/dL (ref 12.0–15.0)
Lymphocytes Relative: 17.9 % (ref 12.0–46.0)
Lymphs Abs: 1.4 10*3/uL (ref 0.7–4.0)
MCHC: 33.1 g/dL (ref 30.0–36.0)
MCV: 92.2 fl (ref 78.0–100.0)
Monocytes Absolute: 0.5 10*3/uL (ref 0.1–1.0)
Monocytes Relative: 6.1 % (ref 3.0–12.0)
NEUTROS ABS: 5.8 10*3/uL (ref 1.4–7.7)
NEUTROS PCT: 72.3 % (ref 43.0–77.0)
PLATELETS: 259 10*3/uL (ref 150.0–400.0)
RBC: 4.36 Mil/uL (ref 3.87–5.11)
RDW: 12.7 % (ref 11.5–15.5)
WBC: 8.1 10*3/uL (ref 4.0–10.5)

## 2015-02-07 LAB — COMPREHENSIVE METABOLIC PANEL
ALT: 15 U/L (ref 0–35)
AST: 14 U/L (ref 0–37)
Albumin: 4.5 g/dL (ref 3.5–5.2)
Alkaline Phosphatase: 51 U/L (ref 39–117)
BUN: 12 mg/dL (ref 6–23)
CALCIUM: 9.9 mg/dL (ref 8.4–10.5)
CHLORIDE: 104 meq/L (ref 96–112)
CO2: 27 meq/L (ref 19–32)
Creatinine, Ser: 0.65 mg/dL (ref 0.40–1.20)
GFR: 108.4 mL/min (ref >60.00)
Glucose, Bld: 85 mg/dL (ref 70–99)
Potassium: 4.5 mEq/L (ref 3.5–5.1)
Sodium: 138 mEq/L (ref 135–145)
Total Bilirubin: 0.9 mg/dL (ref 0.2–1.2)
Total Protein: 6.5 g/dL (ref 6.0–8.3)

## 2015-02-07 LAB — C-REACTIVE PROTEIN: CRP: 0.8 mg/dL (ref 0.5–20.0)

## 2015-02-07 LAB — SEDIMENTATION RATE: Sed Rate: 10 mm/hr (ref 0–22)

## 2015-02-07 LAB — URIC ACID: URIC ACID, SERUM: 3.4 mg/dL (ref 2.4–7.0)

## 2015-02-07 MED ORDER — DOXYCYCLINE HYCLATE 100 MG PO TABS: 100.0000 mg | ORAL_TABLET | Freq: Two times a day (BID) | ORAL | Status: DC

## 2015-02-07 NOTE — Progress Notes (Signed)
OFFICE VISIT  02/07/2015   CC:  Chief Complaint  Patient presents with  . Knee Pain    x 2 days   HPI:    Patient is a 39 y.o. Caucasian female who presents for concern of lyme dz. Found a tick full of blood on scalp about 3 wks ago. Has been fighting some URI/bronchitis sx's that seem to be resolving.  Some waxing/waning low grade fevers. Last 4-5 days has been aching in L/S back region diffusely, then this spontaneously resolved 2 days ago. At that time she noted L knee swelling and hurting and hot to touch--this was 48h ago.  Took advil and it helped a little.  No other joints bothering her.  No recent joint sprain or trauma. It is hard for her to discern whether the swelling is peri-artic or in the joint.  Pain worse with wt bearing. No obvious fevers.  Fatigue the last 1 mo.  No rash noted.  She has not had any flares of psoriasis or dermatitis herpetiformis lately---since changing to gluten free diet. LMP was 2 wks ago and pt is breast feeding.  Past Medical History  Diagnosis Date  . HSV (herpes simplex virus) anogenital infection   . Gestational hypertension without significant proteinuria in third trimester 11/09/2010  . GERD (gastroesophageal reflux disease)     pregnancy only  . Palpitations     stress related palpitations  . Lactose intolerance   . Psoriasis     occ left knee pain WITHOUT swelling--resolves with use of CLARITIN (??.)  No hx suspicious for psoriatic arthritis.  . Vitamin D deficiency   . 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 Leep  2005  . Femoral hernia repair  2009    left  . Cesarean section  11/09/2010    Procedure: CESAREAN SECTION;  Surgeon: Lovenia Kim, MD;  Location: [facility];  Service: Gynecology;  Laterality: N/A;  Primary Cesarean Section with birth of baby boy @ 2316  . Dilation and evacuation  01/16/2012    Procedure: DILATATION AND EVACUATION;  Surgeon: Lovenia Kim, MD;  Location: [facility];  Service: Gynecology;  Laterality: N/A;  . Cesarean section N/A 01/23/2013    Procedure: CESAREAN SECTION;  Surgeon: Lovenia Kim, MD;  Location: [facility];  Service: Obstetrics;  Laterality: N/A;  . Colonoscopy  04/2008    done for hematochezia (normal).  Needs screening TCS age 14    Outpatient Prescriptions Prior to Visit  Medication Sig Dispense Refill  . desoximetasone (TOPICORT) 0.05 % cream Apply 1 application topically daily.    . valACYclovir (VALTREX) 500 MG tablet Take 500 mg by mouth 2 (two) times daily.    . Betamethasone Valerate 0.12 % foam Apply 1 application topically daily as needed (for psoriasis).    . hydrocortisone (ANUSOL-HC) 2.5 % rectal cream Place 1 application rectally daily as needed for hemorrhoids or itching.    . Vitamin D, Ergocalciferol, (DRISDOL) 50000 UNITS CAPS capsule Take 1 capsule (50,000 Units total) by mouth every 7 (seven) days. (Patient not taking: Reported on 02/07/2015) 12 capsule 0   No facility-administered medications prior to visit.    Allergies  Allergen Reactions  . Albuterol Shortness Of Breath    ?rebound bronchospasm when used q3-4 hours?  . Codeine Itching and Nausea And Vomiting    Can tolerate Tramadol  . Penicillins Hives  and Swelling  . Sulfa Antibiotics Swelling  . Trimethoprim Swelling    ROS As per HPI  PE: Blood pressure 123/84, pulse 74, temperature 99.1 F (37.3 C), temperature source Oral, resp. rate 16, height 5' 10"  (1.778 m), weight 176 lb (79.833 kg), last menstrual period 01/20/2015, SpO2 99 %, currently breastfeeding. Gen: Alert, well appearing.  Patient is oriented to person, place, time, and situation. GQB:VQXI: no injection, icteris, swelling, or exudate.  EOMI, PERRLA. Mouth: lips without lesion/swelling.  Oral mucosa pink and moist. Oropharynx without erythema, exudate, or swelling.  CV: RRR, no m/r/g.   LUNGS:  CTA bilat, nonlabored resps, good aeration in all lung fields. EXT: no clubbing, cyanosis, or edema.  Left knee: +warmth, +erythema, + swelling to anterior aspect of L knee. She can fully flex and extend the knee actively and passively and there is only increased L knee pain when she is at the maximum of knee flexion.  +Tissue edema over patella on L, + tenderness with movement of patella on L, mild lateral and medial joint line TTP on L knee.   No other joints with any swelling, warmth, or erythema.  LABS:  None today  IMPRESSION AND PLAN:  Acute left knee pain and swelling, exam most suggestive of prepatellar bursitis. Not convinced pt has left knee joint effusion. She declined to let me aspirate her knee today. 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 Discussed options and decided on lyme ab titers w/reflex western blot: if she has true disseminated dz she should have elevated antibody titer to borrelia.  Will also check CBC, CMET, ESR, CRP, and uric acid level. She did want to take empiric tx for lyme arthritis: doxy 100 mg bid x 30d sent to pharmacy.  Therapeutic expectations and side effect profile of medication discussed today.  Patient's questions answered. Also recommended ibuprofen 600 mg bid-tid with food, ice/compression on L knee cap, relative rest. Signs/symptoms to call or return for were reviewed and pt expressed understanding.  An After Visit Summary was printed and given to the patient. She also asked for rabies titers to be drawn on her b/c of being a vet and being exposed to this dz so I drew this today.  FOLLOW UP: Return if symptoms worsen or fail to improve.

## 2015-02-07 NOTE — Progress Notes (Signed)
Pre visit review using our clinic review tool, if applicable. No additional management support is needed unless otherwise documented below in the visit note. 

## 2015-02-08 LAB — LYME AB/WESTERN BLOT REFLEX: B burgdorferi Ab IgG+IgM: 0.4 {ISR}

## 2015-03-01 LAB — RABIES VIRUS ANTIBODY TITER

## 2015-03-01 IMAGING — CR DG CHEST 1V
1 series · 1 of 1 positions shown · non-contrast
Comparison: 02/09/2014.  01/05/2014.

CLINICAL DATA: Abnormal chest x-ray air earlier today. Questionable
mass in the RIGHT upper lobe.

EXAM:
CHEST - 1 VIEW

[w chest ap]
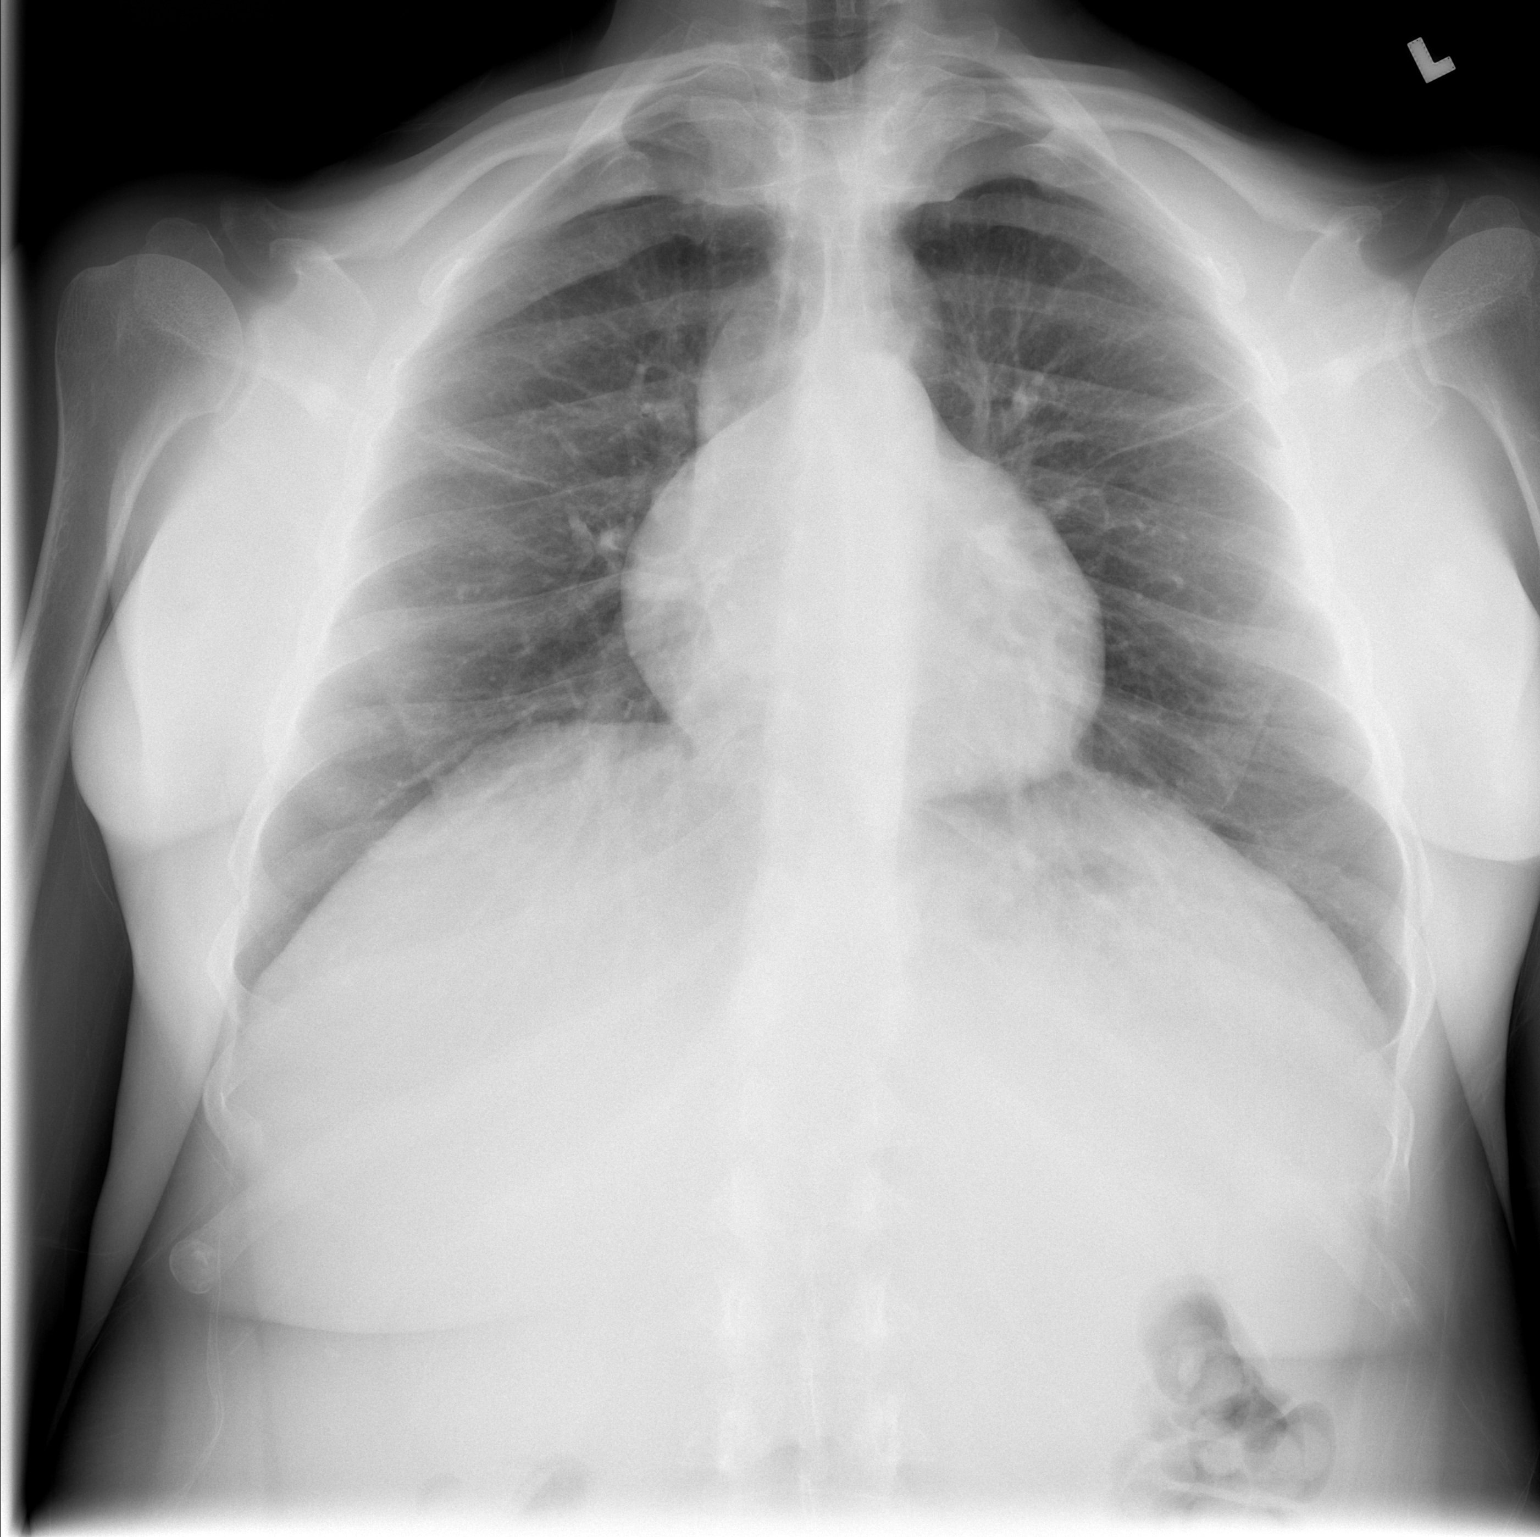

[1 of 1 positions shown; findings below may reference images not displayed]

FINDINGS: Apical lordotic frontal projection is submitted for interpretation.
The apices are clear. The density on prior radiograph represented
prominent costochondral cartilage.
IMPRESSION: No RIGHT upper lobe mass.  No active cardiopulmonary disease.

## 2015-03-29 ENCOUNTER — Ambulatory Visit (HOSPITAL_BASED_OUTPATIENT_CLINIC_OR_DEPARTMENT_OTHER)
Admission: RE | Admit: 2015-03-29 | Discharge: 2015-03-29 | Disposition: A | Discharge status: Home / Self Care | Payer: 59 | Source: Ambulatory Visit | Attending: Family Medicine | Admitting: Family Medicine

## 2015-03-29 ENCOUNTER — Ambulatory Visit (INDEPENDENT_AMBULATORY_CARE_PROVIDER_SITE_OTHER): Payer: 59 | Admitting: Family Medicine

## 2015-03-29 ENCOUNTER — Encounter: Payer: Self-pay | Admitting: Family Medicine

## 2015-03-29 VITALS — BP 116/82 | HR 86 | Temp 98.6°F | Resp 20 | Wt 173.2 lb

## 2015-03-29 DIAGNOSIS — R05 Cough: Secondary | ICD-10-CM

## 2015-03-29 DIAGNOSIS — W57XXXA Bitten or stung by nonvenomous insect and other nonvenomous arthropods, initial encounter: Secondary | ICD-10-CM

## 2015-03-29 DIAGNOSIS — J189 Pneumonia, unspecified organism: Secondary | ICD-10-CM | POA: Insufficient documentation

## 2015-03-29 DIAGNOSIS — S0006XD Insect bite (nonvenomous) of scalp, subsequent encounter: Secondary | ICD-10-CM

## 2015-03-29 DIAGNOSIS — J209 Acute bronchitis, unspecified: Secondary | ICD-10-CM | POA: Diagnosis not present

## 2015-03-29 DIAGNOSIS — R509 Fever, unspecified: Secondary | ICD-10-CM | POA: Insufficient documentation

## 2015-03-29 DIAGNOSIS — S0006XA Insect bite (nonvenomous) of scalp, initial encounter: Secondary | ICD-10-CM | POA: Insufficient documentation

## 2015-03-29 DIAGNOSIS — R059 Cough, unspecified: Secondary | ICD-10-CM

## 2015-03-29 DIAGNOSIS — W57XXXD Bitten or stung by nonvenomous insect and other nonvenomous arthropods, subsequent encounter: Secondary | ICD-10-CM

## 2015-03-29 MED ORDER — DOXYCYCLINE HYCLATE 100 MG PO TABS: 100.0000 mg | ORAL_TABLET | Freq: Two times a day (BID) | ORAL | Status: DC

## 2015-03-29 NOTE — Patient Instructions (Signed)
I have called in doxycycline for you to take another 14 days.  Treating as bronchitis vs pneumonia for now.  Repeat titers and cbc ordered and can be collected with your cxr.  We will call you with results once available.  Continue to treat symptoms with advil/rest/hydration +/- mucinex

## 2015-03-29 NOTE — Progress Notes (Signed)
Patient ID: Sharon Trujillo, female   DOB: 1975/12/15, 40 y.o.   MRN: [id number]   Subjective:    Patient ID: Sharon Trujillo   DOB: 05/26/1975, 40 y.o.    MRN: [id number]  HPI  Cough with fever: Patient presents with increasing cough and recurrent fever. Patient states approximately mid/early November she had experienced a fatigue, cough and thought maybe it was bronchitis. In she was exposed to take plenty on her scalp, and had fluctuant fevers and eventually left knee bursitis. She was treated with doxycycline 30 day treatment, Lyme titers were drawn 3 weeks into exposure and were negative. She states the fever had resolved, as well as the bursitis after doxycycline use. However the cough has remained constant throughout this entire process since November. She reports approximately 2-3 weeks ago her husband brought home a prior, and then she had noticed development of rhinorrhea, headache, fever Tmax of 102.7 on Saturday, with a daily fever since. Patient has taken Advil for headache and fever today. Patient is up-to-date with her flu shot and tetanus shot. 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 Patient denies any development of rash. She does admit to one episode of diarrhea within the last week.  Past Medical History  Diagnosis Date  . HSV (herpes simplex virus) anogenital infection   . Gestational hypertension without significant proteinuria in third trimester 11/09/2010  . GERD (gastroesophageal reflux disease)     pregnancy only  . Palpitations     stress related palpitations  . Lactose intolerance   . Psoriasis     occ left knee pain WITHOUT swelling--resolves with use of CLARITIN (??.)  No hx suspicious for psoriatic arthritis.  . Vitamin D deficiency   . 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 Albuterol Shortness Of Breath    ?rebound bronchospasm when used  q3-4 hours?  . Codeine Itching and Nausea And Vomiting    Can tolerate Tramadol  . Penicillins Hives and Swelling  . Sulfa Antibiotics Swelling  . Trimethoprim Swelling   Past Surgical History  Procedure Laterality Date  . Leep  2005  . Femoral hernia repair  2009    left  . Cesarean section  11/09/2010    Procedure: CESAREAN SECTION;  Surgeon: Lenoard Aden, MD;  Location: [facility];  Service: Gynecology;  Laterality: N/A;  Primary Cesarean Section with birth of baby boy @ 2316  . Dilation and evacuation  01/16/2012    Procedure: DILATATION AND EVACUATION;  Surgeon: Lenoard Aden, MD;  Location: [facility];  Service: Gynecology;  Laterality: N/A;  . Cesarean section N/A 01/23/2013    Procedure: CESAREAN SECTION;  Surgeon: Lenoard Aden, MD;  Location: [facility];  Service: Obstetrics;  Laterality: N/A;  . Colonoscopy  04/2008    done for hematochezia (normal).  Needs screening TCS age 36   Social History  Substance Use Topics  . Smoking status: Never Smoker   . Smokeless tobacco: Not on file  . Alcohol Use: No    Review of Systems Negative, with the exception of above mentioned in HPI     Objective:   Physical Exam BP 116/82 mmHg  Pulse 86  Temp(Src) 98.6 F (37 C)  Resp 20  Wt 173 lb 4 oz (78.586 kg)  SpO2 97%  LMP 03/14/2015 Body mass index is 24.86 kg/(m^2). Gen: Afebrile. No acute distress. Nontoxic in  appearance. Well developed, well nourished, appears fatigued.  HENT: AT. [place]. Bilateral TM visualized, normal. MMM, no oral lesions. Bilateral nares mild erythema, no swelling. Throat without erythema, no exudates. Cough present Hoarseness not present Eyes:Pupils Equal Round Reactive to light, Extraocular movements intact,  Conjunctiva without redness, discharge or icterus. Neck/lymp/endocrine: Supple, nolymphadenopathy CV: RRR  Chest: LLL crackles. Cough with deep inspiration. Mild rhonchi. Good air movement, normal resp effort.  MSK: Left knee bursitis resolved, no  effusions, no soft tissue swelling, mild bruising discoloration. TTP medial inferior bursae.  Skin: No  rashes, purpura or petechiae. Bruising over medial knee, from prior bursitis.     Assessment & Plan:  Sharon Trujillo is a 40 y.o. present for acute OV  Cough/fever - > 4 weeks of cough, now with fever reoccurring. Uncertain if symptoms are sequela of tick bite, recent acute illness or development of bronchitis/pneumonia.  - Possible other job-related exposure? She is a International aid/development worker. - Over-the-counter therapies for cough, consider Mucinex use. Consider humidifier use. - DG Chest 2 View; Future  Tick bite of scalp, subsequent encounter - will rpt titers, first titers may have been early being  3 weeks into exposure. Pt was appropriately treated with doxycycline. - B. Burgdorfi Antibodies by WB; Future - CBC w/Diff; Future  Acute bronchitis, unspecified organism - symptoms of at least acute bronchitis, with crackles left lower lobe. - doxycycline (VIBRA-TABS) 100 MG tablet; Take 1 tablet (100 mg total) by mouth 2 (two) times daily.  Dispense: 20 tablet; Refill: 0  F/u 2 weeks

## 2015-03-30 ENCOUNTER — Other Ambulatory Visit: Payer: Self-pay | Admitting: Family Medicine

## 2015-03-30 ENCOUNTER — Other Ambulatory Visit (INDEPENDENT_AMBULATORY_CARE_PROVIDER_SITE_OTHER): Payer: 59

## 2015-03-30 ENCOUNTER — Telehealth: Payer: Self-pay | Admitting: *Deleted

## 2015-03-30 DIAGNOSIS — S0006XD Insect bite (nonvenomous) of scalp, subsequent encounter: Secondary | ICD-10-CM | POA: Diagnosis not present

## 2015-03-30 DIAGNOSIS — J181 Lobar pneumonia, unspecified organism: Principal | ICD-10-CM

## 2015-03-30 DIAGNOSIS — W57XXXD Bitten or stung by nonvenomous insect and other nonvenomous arthropods, subsequent encounter: Secondary | ICD-10-CM

## 2015-03-30 DIAGNOSIS — J189 Pneumonia, unspecified organism: Secondary | ICD-10-CM

## 2015-03-30 LAB — CBC WITH DIFFERENTIAL/PLATELET
BASOS ABS: 0 10*3/uL (ref 0.0–0.1)
BASOS PCT: 0 % (ref 0–1)
EOS PCT: 1 % (ref 0–5)
Eosinophils Absolute: 0.1 10*3/uL (ref 0.0–0.7)
HEMATOCRIT: 39.5 % (ref 36.0–46.0)
HEMOGLOBIN: 13.1 g/dL (ref 12.0–15.0)
LYMPHS PCT: 18 % (ref 12–46)
Lymphs Abs: 0.9 10*3/uL (ref 0.7–4.0)
MCH: 30.3 pg (ref 26.0–34.0)
MCHC: 33.2 g/dL (ref 30.0–36.0)
MCV: 91.2 fL (ref 78.0–100.0)
MPV: 9.6 fL (ref 8.6–12.4)
Monocytes Absolute: 0.7 10*3/uL (ref 0.1–1.0)
Monocytes Relative: 13 % — ABNORMAL HIGH (ref 3–12)
NEUTROS ABS: 3.5 10*3/uL (ref 1.7–7.7)
Neutrophils Relative %: 68 % (ref 43–77)
Platelets: 240 10*3/uL (ref 150–400)
RBC: 4.33 MIL/uL (ref 3.87–5.11)
RDW: 12.7 % (ref 11.5–15.5)
WBC: 5.1 10*3/uL (ref 4.0–10.5)

## 2015-03-30 NOTE — Telephone Encounter (Signed)
Patient calling back to discuss results after further thought. Is concerned about her age and the fact that it was her LLL when she had it last time as well. Would like to have Dr Blenda Bridegroom thought son this as well as her suggestions for further testing.

## 2015-03-30 NOTE — Telephone Encounter (Signed)
Patient aware Dr Claiborne Billings is out of office until Friday. Xray results were reviewed with Dr Milinda Cave. Repeat xray was ordered. No further orders at this time.

## 2015-03-30 NOTE — Telephone Encounter (Signed)
Received call report from [facility], chest xray shows LLL pneumonia . Recommendation for repeat xray 3-4 weeks and antibiotic therapy. Patient is currently being treated with Doxycycline. See last OV for reference. Patient had labs drawn here this am. Order placed for repeat xray for 3-4 weeks. Left message for patient to return call to review results of xray and instructions.

## 2015-03-30 NOTE — Telephone Encounter (Signed)
Spoke with patient reviewed xray results and instructions. Answered all patient questions.

## 2015-03-30 NOTE — Telephone Encounter (Signed)
Noted  

## 2015-03-31 ENCOUNTER — Encounter: Payer: Self-pay | Admitting: Family Medicine

## 2015-03-31 DIAGNOSIS — J189 Pneumonia, unspecified organism: Secondary | ICD-10-CM | POA: Insufficient documentation

## 2015-03-31 DIAGNOSIS — J181 Lobar pneumonia, unspecified organism: Secondary | ICD-10-CM

## 2015-03-31 NOTE — Telephone Encounter (Signed)
Spoke with patient she is not available next week for an appt. She states she is doing better. She scheduled follow up appt for 04/11/15

## 2015-03-31 NOTE — Telephone Encounter (Signed)
Noted that patient would like to further discuss her cxr and prior history. I have not received all her labs back yet and that will likely take until next week to get the titers returned.  - She should have a follow up appt scheduled next week to ensure improvement on current treatment plan/abx. We can fully discuss any other concerns and review all labs/imagining at that time.

## 2015-04-01 ENCOUNTER — Telehealth: Payer: Self-pay | Admitting: Family Medicine

## 2015-04-01 LAB — LYME ABY, WSTRN BLT IGG & IGM W/BANDS
B BURGDORFERI IGM ABS (IB): NEGATIVE
B burgdorferi IgG Abs (IB): NEGATIVE
LYME DISEASE 23 KD IGM: NONREACTIVE
LYME DISEASE 39 KD IGM: NONREACTIVE
LYME DISEASE 41 KD IGG: NONREACTIVE
LYME DISEASE 58 KD IGG: NONREACTIVE
LYME DISEASE 66 KD IGG: NONREACTIVE
LYME DISEASE 93 KD IGG: NONREACTIVE
Lyme Disease 18 kD IgG: NONREACTIVE
Lyme Disease 23 kD IgG: NONREACTIVE
Lyme Disease 28 kD IgG: NONREACTIVE
Lyme Disease 30 kD IgG: NONREACTIVE
Lyme Disease 39 kD IgG: NONREACTIVE
Lyme Disease 41 kD IgM: NONREACTIVE
Lyme Disease 45 kD IgG: NONREACTIVE

## 2015-04-01 NOTE — Telephone Encounter (Signed)
Spoke with patient reviewed lab results. 

## 2015-04-01 NOTE — Telephone Encounter (Signed)
Please call pt  - her lyme titers are negative.  - We will discuss all results in more detail on her followup

## 2015-04-08 ENCOUNTER — Ambulatory Visit (INDEPENDENT_AMBULATORY_CARE_PROVIDER_SITE_OTHER): Payer: 59 | Admitting: Family Medicine

## 2015-04-08 ENCOUNTER — Encounter: Payer: Self-pay | Admitting: Family Medicine

## 2015-04-08 VITALS — BP 126/84 | HR 92 | Temp 98.3°F | Resp 20 | Wt 174.2 lb

## 2015-04-08 DIAGNOSIS — J189 Pneumonia, unspecified organism: Secondary | ICD-10-CM | POA: Diagnosis not present

## 2015-04-08 DIAGNOSIS — J181 Lobar pneumonia, unspecified organism: Principal | ICD-10-CM

## 2015-04-08 NOTE — Progress Notes (Signed)
Patient ID: RAELEE Trujillo, female   DOB: February 21, 1976, 40 y.o.   MRN: [id number] Patient ID: Sharon Trujillo, female   DOB: 05/05/1975, 40 y.o.   MRN: [id number]   Subjective:    Patient ID: Sharon Trujillo   DOB: 10-May-1975, 40 y.o.    MRN: [id number]  HPI  Last dose of doxycyline  Left liver lobe pneumonia: Patient presents for follow-up on her left lower lobe pneumonia. She is on her last visit doxycycline tomorrow. She reports feeling improved, no additional fevers. She does complain that the cough has remained. She has concerns that the pneumonia is not resolving since the cough is remaining.   Chest x-ray results were reviewed with patient, as well as imaging pictures and labs. In comparison to prior pneumonia films, this incident seems to be in the same location. Patient is concerned about having pneumonia at her age and at the same location as prior. Patient is a International aid/development worker. She also has pets at home, as well as a chicken coup. In addition she does enjoy caving as well.  She is wondering if she made herself immunocompromised because she was applying practically full body steroid cream on just prior to this infection for a gluten dermatitis. Past Medical History  Diagnosis Date  . HSV (herpes simplex virus) anogenital infection   . Gestational hypertension without significant proteinuria in third trimester 11/09/2010  . GERD (gastroesophageal reflux disease)     pregnancy only  . Palpitations     stress related palpitations  . Lactose intolerance   . Psoriasis     occ left knee pain WITHOUT swelling--resolves with use of CLARITIN (??.)  No hx suspicious for psoriatic arthritis.  . Vitamin D deficiency   . 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 Albuterol Shortness Of Breath    ?rebound bronchospasm when used q3-4 hours?  . Codeine Itching and Nausea And Vomiting    Can tolerate  Tramadol  . Penicillins Hives and Swelling  . Sulfa Antibiotics Swelling  . Trimethoprim Swelling   Past Surgical History  Procedure Laterality Date  . Leep  2005  . Femoral hernia repair  2009    left  . Cesarean section  11/09/2010    Procedure: CESAREAN SECTION;  Surgeon: Lenoard Aden, MD;  Location: [facility];  Service: Gynecology;  Laterality: N/A;  Primary Cesarean Section with birth of baby boy @ 2316  . Dilation and evacuation  01/16/2012    Procedure: DILATATION AND EVACUATION;  Surgeon: Lenoard Aden, MD;  Location: [facility];  Service: Gynecology;  Laterality: N/A;  . Cesarean section N/A 01/23/2013    Procedure: CESAREAN SECTION;  Surgeon: Lenoard Aden, MD;  Location: [facility];  Service: Obstetrics;  Laterality: N/A;  . Colonoscopy  04/2008    done for hematochezia (normal).  Needs screening TCS age 13   Social History  Substance Use Topics  . Smoking status: Never Smoker   . Smokeless tobacco: Not on file  . Alcohol Use: No    Review of Systems Negative, with the exception of above mentioned in HPI     Objective:   Physical Exam BP 126/84 mmHg  Pulse 92  Temp(Src) 98.3 F (36.8 C) (Oral)  Resp 20  Wt 174 lb 4 oz (79.039 kg)  SpO2 99%  LMP 03/14/2015 Body mass index is 25 kg/(m^2). Gen: Afebrile. No acute distress. Nontoxic in  appearance. Well developed, well nourished, appears fatigued.  HENT: AT. [place]. MMM, no oral lesions.  Cough present. Eyes:Pupils Equal Round Reactive to light, Extraocular movements intact,  Conjunctiva without redness, discharge or icterus. Neck/lymp/endocrine: Supple, no lymphadenopathy CV: RRR  Chest: fine LLL crackles, still present. Cough with deep inspiration.  Good air movement, normal resp effort.      Assessment & Plan:  Sharon Trujillo is a 40 y.o. present for acute OV  Pneumonia: - obvious infiltrate of LLL. ? Same location as last PNA 1 year ago. ? WBC normal. - Possible other job-related exposure? She is a International aid/development worker,  also has chicken coups, fungal (caving) -  Mucinex use. Consider humidifier use. - DG Chest 2 View; Future for follow up in 2-3 weeks to ensure resolution. Consider  - Lyme titers were negative.    F/u 2 weeks after repeat chest xray  > 25 minutes spent with patient, >50% of time spent face to face counseling patient and coordinating care.

## 2015-04-08 NOTE — Patient Instructions (Signed)
After valentines get cxr and then appt after

## 2015-04-11 ENCOUNTER — Ambulatory Visit: Payer: 59 | Admitting: Family Medicine

## 2015-05-02 ENCOUNTER — Ambulatory Visit (HOSPITAL_BASED_OUTPATIENT_CLINIC_OR_DEPARTMENT_OTHER)
Admission: RE | Admit: 2015-05-02 | Discharge: 2015-05-02 | Disposition: A | Discharge status: Home / Self Care | Payer: 59 | Source: Ambulatory Visit | Attending: Family Medicine | Admitting: Family Medicine

## 2015-05-02 ENCOUNTER — Ambulatory Visit (INDEPENDENT_AMBULATORY_CARE_PROVIDER_SITE_OTHER): Payer: 59 | Admitting: Family Medicine

## 2015-05-02 ENCOUNTER — Telehealth: Payer: Self-pay | Admitting: Family Medicine

## 2015-05-02 ENCOUNTER — Encounter: Payer: Self-pay | Admitting: Family Medicine

## 2015-05-02 VITALS — BP 117/76 | HR 77 | Temp 99.6°F | Resp 20 | Wt 174.5 lb

## 2015-05-02 DIAGNOSIS — J189 Pneumonia, unspecified organism: Secondary | ICD-10-CM | POA: Insufficient documentation

## 2015-05-02 DIAGNOSIS — R053 Chronic cough: Secondary | ICD-10-CM | POA: Insufficient documentation

## 2015-05-02 DIAGNOSIS — R05 Cough: Secondary | ICD-10-CM | POA: Diagnosis not present

## 2015-05-02 DIAGNOSIS — J181 Lobar pneumonia, unspecified organism: Secondary | ICD-10-CM

## 2015-05-02 NOTE — Progress Notes (Signed)
Patient ID: Sharon Trujillo, female   DOB: June 16, 1975, 40 y.o.   MRN: [id number]   Subjective:    Patient ID: Sharon Trujillo   DOB: 1975/11/24, 40 y.o.    MRN: [id number]  HPI  Cough: Patient returns to clinic for follow-up chronic cough since November. She states that she is feeling much better from her pneumonia perspective although she is still worried about why she has repeat pneumonia at her young age and the same location 2 years in a row. She states that she's had no more fevers since our last encounter. He is feeling better, more energetic but the cough has remained constant greater than 8 weeks now. She did take the recommendations of using over-the-counter cough suppressants including Mucinex her last office visit, however she feels this made no difference in her chronic cough. She is bringing up "globes of Plegm ", that is sometimes yellow in appearance. She has been decreasing the amount of topical steroid cream she's been using for her dermatitis, which is believed secondary to celiac. She is responding to daily Allegra use for her allergies and skin condition. Patient is a International aid/development worker, has many exposures to chemicals, diseases etc. She also enjoys caving, and has a chicken coop at home.  Prior our office visit 04/08/2015: Left liver lobe pneumonia: Patient presents for follow-up on her left lower lobe pneumonia. She is on her last visit doxycycline tomorrow. She reports feeling improved, no additional fevers. She does complain that the cough has remained. She has concerns that the pneumonia is not resolving since the cough is remaining.   Chest x-ray results were reviewed with patient, as well as imaging pictures and labs. In comparison to prior pneumonia films, this incident seems to be in the same location. Patient is concerned about having pneumonia at her age and at the same location as prior. Patient is a International aid/development worker. She also has pets at home, as well as a chicken coup. In addition  she does enjoy caving as well.  She is wondering if she made herself immunocompromised because she was applying practically full body steroid cream on just prior to this infection for a gluten dermatitis  Prior office visit 03/29/2015: Cough with fever: Patient presents with increasing cough and recurrent fever. Patient states approximately mid/early November she had experienced a fatigue, cough and thought maybe it was bronchitis. In she was exposed to take plenty on her scalp, and had fluctuant fevers and eventually left knee bursitis. She was treated with doxycycline 30 day treatment, Lyme titers were drawn 3 weeks into exposure and were negative. She states the fever had resolved, as well as the bursitis after doxycycline use. However the cough has remained constant throughout this entire process since November. She reports approximately 2-3 weeks ago her husband brought home a prior, and then she had noticed development of rhinorrhea, headache, fever Tmax of 102.7 on Saturday, with a daily fever since. Patient has taken Advil for headache and fever today. Patient is up-to-date with her flu shot and tetanus shot. 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 Patient denies any development of rash. She does admit to one episode of diarrhea within the last week.  Past Medical History  Diagnosis Date  . HSV (herpes simplex virus) anogenital infection   . Gestational hypertension without significant proteinuria in third trimester 11/09/2010  . GERD (gastroesophageal reflux disease)     pregnancy only  . Palpitations     stress related palpitations  . Lactose  intolerance   . Psoriasis     occ left knee pain WITHOUT swelling--resolves with use of CLARITIN (??.)  No hx suspicious for psoriatic arthritis.  . Vitamin D deficiency   . 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  Albuterol Shortness Of Breath    ?rebound bronchospasm when used q3-4 hours?  . Codeine Itching and Nausea And Vomiting    Can tolerate Tramadol  . Penicillins Hives and Swelling  . Sulfa Antibiotics Swelling  . Trimethoprim Swelling   Past Surgical History  Procedure Laterality Date  . Leep  2005  . Femoral hernia repair  2009    left  . Cesarean section  11/09/2010    Procedure: CESAREAN SECTION;  Surgeon: Lenoard Aden, MD;  Location: [facility];  Service: Gynecology;  Laterality: N/A;  Primary Cesarean Section with birth of baby boy @ 2316  . Dilation and evacuation  01/16/2012    Procedure: DILATATION AND EVACUATION;  Surgeon: Lenoard Aden, MD;  Location: [facility];  Service: Gynecology;  Laterality: N/A;  . Cesarean section N/A 01/23/2013    Procedure: CESAREAN SECTION;  Surgeon: Lenoard Aden, MD;  Location: [facility];  Service: Obstetrics;  Laterality: N/A;  . Colonoscopy  04/2008    done for hematochezia (normal).  Needs screening TCS age 30   Social History  Substance Use Topics  . Smoking status: Never Smoker   . Smokeless tobacco: Not on file  . Alcohol Use: No    Review of Systems Negative, with the exception of above mentioned in HPI     Objective:   Physical Exam BP 117/76 mmHg  Pulse 77  Temp(Src) 99.6 F (37.6 C) (Oral)  Resp 20  Wt 174 lb 8 oz (79.153 kg)  SpO2 100%  LMP 04/29/2015 Body mass index is 25.04 kg/(m^2). Gen: Afebrile. No acute distress. Nontoxic in appearance. Well developed, well nourished, appears fatigued.  HENT: AT. [place]. Bilateral TM visualized, normal. MMM, no oral lesions. Bilateral nares mild erythema, no swelling. Throat without erythema, no exudates. Cough present Hoarseness not present Eyes:Pupils Equal Round Reactive to light, Extraocular movements intact,  Conjunctiva without redness, discharge or icterus. Neck/lymp/endocrine: Supple, no lymphadenopathy CV: RRR  Chest: Clear to auscultation bilaterally, no crackles, no wheezes,  no rhonchi. Good air movement. Normal respiratory effort. Mild cough on exam. Skin: No  purpura or petechiae. Bruising over medial knee, from prior bursitis. Rash right wrist and elbow present today.    Assessment & Plan:  Sharon Trujillo is a 40 y.o. present for follow up OV for chronic cough, with prior abnl cxr/pna.  Cough/fever - > 12 weeks of cough, fever resolved. - Possible other job-related exposure? She is a International aid/development worker. - Over-the-counter therapies for cough have not been effective.   - No medication on med list of concern.  - Considering Pulmonology referral for continued cough.     Dg Chest 2 View  05/02/2015  CLINICAL DATA:  Recheck pneumonia EXAM: CHEST  2 VIEW COMPARISON:  03/29/2015 FINDINGS: Interval clearance of the left lower lobe infiltrate. Lungs are clear. Heart is normal size. No effusions or acute bony abnormality. IMPRESSION: Resolution of previously seen left lower lobe pneumonia. No active disease. Electronically Signed   By: Charlett Nose M.D.   On: 05/02/2015 09:58

## 2015-05-02 NOTE — Telephone Encounter (Signed)
Please call patient, her chest x-ray is completely clear. I did go ahead and refer her to pulmonology for her chronic cough. She should be hearing from somebody set this up within the next week.

## 2015-05-02 NOTE — Telephone Encounter (Signed)
Left message for patient with xray results and information regarding referral on patient voice mail.

## 2015-05-02 NOTE — Patient Instructions (Signed)
Cough, Adult Coughing is a reflex that clears your throat and your airways. Coughing helps to heal and protect your lungs. It is normal to cough occasionally, but a cough that happens with other symptoms or lasts a long time may be a sign of a condition that needs treatment. A cough may last only 2-3 weeks (acute), or it may last longer than 8 weeks (chronic). CAUSES Coughing is commonly caused by:  Breathing in substances that irritate your lungs.  A viral or bacterial respiratory infection.  Allergies.  Asthma.  Postnasal drip.  Smoking.  Acid backing up from the stomach into the esophagus (gastroesophageal reflux).  Certain medicines.  Chronic lung problems, including COPD (or rarely, lung cancer).  Other medical conditions such as heart failure. HOME CARE INSTRUCTIONS  Pay attention to any changes in your symptoms. Take these actions to help with your discomfort:  Take medicines only as told by your health care provider.  If you were prescribed an antibiotic medicine, take it as told by your health care provider. Do not stop taking the antibiotic even if you start to feel better.  Talk with your health care provider before you take a cough suppressant medicine.  Drink enough fluid to keep your urine clear or pale yellow.  If the air is dry, use a cold steam vaporizer or humidifier in your bedroom or your home to help loosen secretions.  Avoid anything that causes you to cough at work or at home.  If your cough is worse at night, try sleeping in a semi-upright position.  Avoid cigarette smoke. If you smoke, quit smoking. If you need help quitting, ask your health care provider.  Avoid caffeine.  Avoid alcohol.  Rest as needed. SEEK MEDICAL CARE IF:   You have new symptoms.  You cough up pus.  Your cough does not get better after 2-3 weeks, or your cough gets worse.  You cannot control your cough with suppressant medicines and you are losing sleep.  You  develop pain that is getting worse or pain that is not controlled with pain medicines.  You have a fever.  You have unexplained weight loss.  You have night sweats. SEEK IMMEDIATE MEDICAL CARE IF:  You cough up blood.  You have difficulty breathing.  Your heartbeat is very fast.   This information is not intended to replace advice given to you by your health care provider. Make sure you discuss any questions you have with your health care provider.   Document Released: 08/25/2010 Document Revised: 11/17/2014 Document Reviewed: 05/05/2014 Elsevier Interactive Patient Education [employer]   Get your xray today and I will call you with the results and then discuss plan.

## 2015-05-30 ENCOUNTER — Telehealth: Payer: Self-pay | Admitting: Family Medicine

## 2015-05-30 ENCOUNTER — Encounter: Payer: Self-pay | Admitting: Family Medicine

## 2015-05-30 ENCOUNTER — Ambulatory Visit (INDEPENDENT_AMBULATORY_CARE_PROVIDER_SITE_OTHER): Payer: 59 | Admitting: Family Medicine

## 2015-05-30 VITALS — BP 121/86 | HR 98 | Temp 99.5°F | Ht 70.0 in | Wt 169.1 lb

## 2015-05-30 DIAGNOSIS — J0100 Acute maxillary sinusitis, unspecified: Secondary | ICD-10-CM | POA: Diagnosis not present

## 2015-05-30 MED ORDER — LEVOFLOXACIN 500 MG PO TABS: 500.0000 mg | ORAL_TABLET | Freq: Every day | ORAL | Status: DC

## 2015-05-30 NOTE — Progress Notes (Signed)
OFFICE VISIT  05/30/2015   CC:  Chief Complaint  Patient presents with  . URI   HPI:    Patient is a 40 y.o. Caucasian female who presents for respiratory complaints. Onset about a week ago with cough, nose runny, then got T 102+ for a couple days.  Most recent fever was 3d ago.  Has had fatigue, no appetite, diarrhea x 3-4 per day last week.  No n/v. Lots of purulent nasal mucous.  Mucinex D daytime, nyquil at night, allegra qd.     Past Medical History  Diagnosis Date  . HSV (herpes simplex virus) anogenital infection   . Gestational hypertension without significant proteinuria in third trimester 11/09/2010  . GERD (gastroesophageal reflux disease)     pregnancy only  . Palpitations     stress related palpitations  . Lactose intolerance   . Psoriasis     occ left knee pain WITHOUT swelling--resolves with use of CLARITIN (??.)  No hx suspicious for psoriatic arthritis.  . Vitamin D deficiency   . 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 Leep  2005  . Femoral hernia repair  2009    left  . Cesarean section  11/09/2010    Procedure: CESAREAN SECTION;  Surgeon: Lenoard Aden, MD;  Location: [facility];  Service: Gynecology;  Laterality: N/A;  Primary Cesarean Section with birth of baby boy @ 2316  . Dilation and evacuation  01/16/2012    Procedure: DILATATION AND EVACUATION;  Surgeon: Lenoard Aden, MD;  Location: [facility];  Service: Gynecology;  Laterality: N/A;  . Cesarean section N/A 01/23/2013    Procedure: CESAREAN SECTION;  Surgeon: Lenoard Aden, MD;  Location: [facility];  Service: Obstetrics;  Laterality: N/A;  . Colonoscopy  04/2008    done for hematochezia (normal).  Needs screening TCS age 39    Outpatient Prescriptions Prior to Visit  Medication Sig Dispense Refill  . clobetasol (OLUX) 0.05 % topical foam Apply 1 application topically daily  as needed.  5  . desoximetasone (TOPICORT) 0.05 % cream Apply 1 application topically daily. Reported on 05/02/2015    . triamcinolone cream (KENALOG) 0.1 % Apply 1 application topically 2 (two) times daily.    . valACYclovir (VALTREX) 500 MG tablet Take 500 mg by mouth 2 (two) times daily.     No facility-administered medications prior to visit.    Allergies  Allergen Reactions  . Albuterol Shortness Of Breath    ?rebound bronchospasm when used q3-4 hours?  . Codeine Itching and Nausea And Vomiting    Can tolerate Tramadol  . Penicillins Hives and Swelling  . Sulfa Antibiotics Swelling  . Trimethoprim Swelling    ROS As per HPI  PE: Blood pressure 121/86, pulse 98, temperature 99.5 F (37.5 C), temperature source Oral, height  (1.778 m), weight 169 lb 1.9 oz (76.712 kg), last menstrual period 04/29/2015, SpO2 100 %, currently breastfeeding. VS: noted--normal. Gen: alert, NAD, NONTOXIC APPEARING. HEENT: eyes without injection, drainage, or swelling.  Ears: EACs clear, TMs with normal light reflex and landmarks.  Nose: Scant purulent d/c present, with crusty exudate adherent to mildly injected mucosa.  N Mild diffuse paranasal sinus TTP.  No facial swelling.  Throat and mouth without focal lesion.  No pharyngial swelling, erythema, or exudate.   Neck: supple, no LAD.   LUNGS: CTA bilat, nonlabored resps.  CV: RRR, no m/r/g. EXT: no c/c/e SKIN: no rash  LABS:  none  IMPRESSION AND PLAN:  Acute infectious sinusitis: Due to med allergies, I am going to give her a 10d course of levaquin 500 mg qd. Get otc generic robitussin DM OR Mucinex DM and use as directed on the packaging for cough and congestion. Use otc generic saline nasal spray 2-3 times per day to irrigate/moisturize your nasal passages.  An After Visit Summary was printed and given to the patient.   FOLLOW UP: Return if symptoms worsen or fail to improve.  Signed:  Santiago BumpersPhil McGowen, MD            05/30/2015

## 2015-05-30 NOTE — Telephone Encounter (Signed)
Patient called back checking on Rx. Advised patient it would be ready before 5:00 today. Patient asked for CB so she would know when she can go to pharmacy.

## 2015-05-30 NOTE — Addendum Note (Signed)
Addended by: Judd GaudierLEVENS, Labrea Eccleston M on: 05/30/2015 04:26 PM   Modules accepted: Orders, SmartSet

## 2015-05-30 NOTE — Telephone Encounter (Signed)
Patient went to pharmacy to pick up Rx. They haven't received it, please send to [facility].

## 2015-05-30 NOTE — Telephone Encounter (Signed)
Noted  

## 2015-05-30 NOTE — Telephone Encounter (Signed)
Informed patient levaquin had been sent to the pharmacy.

## 2015-05-31 ENCOUNTER — Telehealth: Payer: Self-pay | Admitting: *Deleted

## 2015-05-31 NOTE — Telephone Encounter (Addendum)
Pt LMOM on 05/31/15 at 12:49pm stating that [facility] not [facility]. She also stated that she would like to speak to someone in regards to how her medications were updated in her chart.   I spoke with Jon[facility] Ru[facility]John at [facility] today and he stated that pt p/u Rx yesterday. I left message for pt to call back to discuss her other concern.

## 2015-06-08 ENCOUNTER — Encounter: Payer: Self-pay | Admitting: Family Medicine

## 2015-06-08 ENCOUNTER — Ambulatory Visit (INDEPENDENT_AMBULATORY_CARE_PROVIDER_SITE_OTHER): Payer: 59 | Admitting: Family Medicine

## 2015-06-08 VITALS — BP 149/76 | HR 105 | Temp 101.9°F | Resp 16 | Ht 70.0 in | Wt 173.8 lb

## 2015-06-08 DIAGNOSIS — R5383 Other fatigue: Secondary | ICD-10-CM

## 2015-06-08 DIAGNOSIS — B999 Unspecified infectious disease: Secondary | ICD-10-CM | POA: Diagnosis not present

## 2015-06-08 DIAGNOSIS — R509 Fever, unspecified: Secondary | ICD-10-CM

## 2015-06-08 NOTE — Progress Notes (Signed)
Pre visit review using our clinic review tool, if applicable. No additional management support is needed unless otherwise documented below in the visit note. 

## 2015-06-08 NOTE — Progress Notes (Signed)
OFFICE NOTE  06/09/2015  CC:  Chief Complaint  Patient presents with  . Fever    started today    HPI:   Patient is a 40 y.o. Caucasian female who is here for fever that started this morning--101.9, along with fatigue I saw her 10d ago for acute sinusitis illness and put her on 10d of levaquin--tomorrow is her last day of this med. From the time of last visit, her nasal d/c has continued but has been clear and thin, and she had still felt fatigued but was improved.  Cough has not stopped since 01/2015--but she admits it waxed and waned some in severity and frequency.  She has appt with Dr. Sherene SiresWert in pulm tomorrow.   Her cough lately is productive and goes on all day.  She aches in her low back, her neck, her knees. No swelling of knees or other joints..  Has "sinus pressure" but no HA.  Appetite is down. No new rash, and her psoriasis has been relatively well controlled of late. No known tick bites.  No urinary sx's.  No recent diarrhea.  She has not had a flu vaccine this season. She admits she is very worried that something atypical is wrong with her such as an odd infection or immune system dysfunction.  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  Pertinent PMH:  Past Medical History  Diagnosis Date  . HSV (herpes simplex virus) anogenital infection   . Gestational hypertension without significant proteinuria in third trimester 11/09/2010  . GERD (gastroesophageal reflux disease)     pregnancy only  . Palpitations     stress related palpitations  . Lactose intolerance   . Psoriasis     occ left knee pain WITHOUT swelling--resolves with use of CLARITIN (??.)  No hx suspicious for psoriatic arthritis.  . Vitamin D deficiency   . 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 Leep  2005  . Femoral hernia repair  2009    left  . Cesarean section   11/09/2010    Procedure: CESAREAN SECTION;  Surgeon: Lenoard Adenichard J Taavon, MD;  Location: [facility];  Service: Gynecology;  Laterality: N/A;  Primary Cesarean Section with birth of baby boy @ 2316  . Dilation and evacuation  01/16/2012    Procedure: DILATATION AND EVACUATION;  Surgeon: Lenoard Adenichard J Taavon, MD;  Location: [facility];  Service: Gynecology;  Laterality: N/A;  . Cesarean section N/A 01/23/2013    Procedure: CESAREAN SECTION;  Surgeon: Lenoard Adenichard J Taavon, MD;  Location: [facility];  Service: Obstetrics;  Laterality: N/A;  . Colonoscopy  04/2008    done for hematochezia (normal).  Needs screening TCS age 40    MEDS;   Outpatient Prescriptions Prior to Visit  Medication Sig Dispense Refill  . clobetasol (OLUX) 0.05 % topical foam Apply 1 application topically daily as needed.  5  . fexofenadine (ALLEGRA) 180 MG tablet Take 180 mg by mouth daily.    Marland Kitchen. levofloxacin (LEVAQUIN) 500 MG tablet Take 1 tablet (500 mg total) by mouth daily. 10 tablet 0  . Probiotic Product (PROBIOTIC DAILY PO) Take by mouth.    . triamcinolone cream (KENALOG) 0.1 % Apply 1 application topically 2 (two) times daily.    . valACYclovir (VALTREX) 500 MG tablet Take 500 mg by mouth 2 (two) times daily.    Marland Kitchen. desoximetasone (TOPICORT) 0.05 %  cream Apply 1 application topically daily. Reported on 06/08/2015    . Pseudoephedrine-APAP-DM (DAYQUIL PO) Take by mouth. Reported on 06/08/2015    . pseudoephedrine-guaifenesin (MUCINEX D) 60-600 MG 12 hr tablet Take 1 tablet by mouth every 12 (twelve) hours. Reported on 06/08/2015     No facility-administered medications prior to visit.    PE: Blood pressure 149/76, pulse 105, temperature 101.9 F (38.8 C), temperature source Oral, resp. rate 16, height  (1.778 m), weight 173 lb 12 oz (78.812 kg), last menstrual period 05/30/2015, SpO2 100 %, currently breastfeeding. VS: noted--normal. Gen: alert, NAD, NONTOXIC APPEARING. HEENT: eyes without injection, drainage, or swelling.  Ears: EACs  clear, TMs with normal light reflex and landmarks.  Nose: Scant clear rhinorrhea, with some dried, crusty exudate adherent to mildly injected mucosa.  No purulent d/c.  No paranasal sinus TTP.  No facial swelling.  Throat and mouth without focal lesion.  No pharyngial swelling, erythema, or exudate.   Neck: supple, no LAD.   LUNGS: CTA bilat, nonlabored resps.   CV: RRR, no m/r/g. EXT: no c/c/e SKIN: no rash  LABS:  Lab Results  Component Value Date   WBC 5.1 03/30/2015   HGB 13.1 03/30/2015   HCT 39.5 03/30/2015   MCV 91.2 03/30/2015   PLT 240 03/30/2015   Lab Results  Component Value Date   CREATININE 0.65 02/07/2015   BUN 12 02/07/2015   NA 138 02/07/2015   K 4.5 02/07/2015   CL 104 02/07/2015   CO2 27 02/07/2015   Lab Results  Component Value Date   ALT 15 02/07/2015   AST 14 02/07/2015   ALKPHOS 51 02/07/2015   BILITOT 0.9 02/07/2015    IMPRESSION AND PLAN:  1) Suspect new viral infection, possibly influenza.  However, with relative frequency of respiratory infections the last few months, pt is highly in favor of a general lab eval to look into more atypical infections as well as possible rheumatologic/autoimmune etiology. Therefore, will check CBC, CMET, CRP, ANA, Rh factor, CCP, Hep Bs Ag, Hep C antibody, HIV screen, RMSF IgM and IgG, and IgA/IgM/IgG testing. She is seeing Dr. Sherene Sires tomorrow for cough so I will wait and see if he gets a CXR on her (it is too late to get one for her today b/c she is going to work after leaving here). No new meds rx'd today.  "Watchful waiting" approach for the next 1-2 days--I'll contact her tomorrow evening to check how she's doing and go over labs that have resulted.  2) Subacute cough (with CAP in Jan 2017): she is getting this evaluated by Dr. Sherene Sires in Pulmonology tomorrow.  Spent 30 min with pt today, with >50% of this time spent in counseling and care coordination regarding the above problems.  An After Visit Summary was printed  and given to the patient.  FOLLOW UP:  Return if symptoms worsen or fail to improve.  Signed:  Santiago Bumpers, MD           06/09/2015

## 2015-06-08 NOTE — Telephone Encounter (Signed)
Pt in office today for ov. Medication list reviewed with pt and updated.

## 2015-06-08 NOTE — Telephone Encounter (Signed)
Left message for pt to call back  °

## 2015-06-09 ENCOUNTER — Ambulatory Visit (INDEPENDENT_AMBULATORY_CARE_PROVIDER_SITE_OTHER)
Admission: RE | Admit: 2015-06-09 | Discharge: 2015-06-09 | Disposition: A | Discharge status: Home / Self Care | Payer: 59 | Source: Ambulatory Visit | Attending: Internal Medicine | Admitting: Internal Medicine

## 2015-06-09 ENCOUNTER — Encounter: Payer: Self-pay | Admitting: Internal Medicine

## 2015-06-09 ENCOUNTER — Ambulatory Visit (INDEPENDENT_AMBULATORY_CARE_PROVIDER_SITE_OTHER): Payer: 59 | Admitting: Internal Medicine

## 2015-06-09 VITALS — BP 108/72 | HR 120 | Temp 99.3°F | Ht 70.75 in | Wt 171.4 lb

## 2015-06-09 DIAGNOSIS — R053 Chronic cough: Secondary | ICD-10-CM

## 2015-06-09 DIAGNOSIS — R05 Cough: Secondary | ICD-10-CM

## 2015-06-09 DIAGNOSIS — J09X2 Influenza due to identified novel influenza A virus with other respiratory manifestations: Secondary | ICD-10-CM | POA: Diagnosis not present

## 2015-06-09 DIAGNOSIS — R059 Cough, unspecified: Secondary | ICD-10-CM

## 2015-06-09 LAB — CBC WITH DIFFERENTIAL/PLATELET
BASOS PCT: 0.1 % (ref 0.0–3.0)
Basophils Absolute: 0 10*3/uL (ref 0.0–0.1)
EOS PCT: 0.2 % (ref 0.0–5.0)
Eosinophils Absolute: 0 10*3/uL (ref 0.0–0.7)
HCT: 37.7 % (ref 36.0–46.0)
HEMOGLOBIN: 12.8 g/dL (ref 12.0–15.0)
Lymphocytes Relative: 4.1 % — ABNORMAL LOW (ref 12.0–46.0)
Lymphs Abs: 0.3 10*3/uL — ABNORMAL LOW (ref 0.7–4.0)
MCHC: 33.9 g/dL (ref 30.0–36.0)
MCV: 90 fl (ref 78.0–100.0)
MONO ABS: 0.4 10*3/uL (ref 0.1–1.0)
Monocytes Relative: 5.6 % (ref 3.0–12.0)
NEUTROS ABS: 5.9 10*3/uL (ref 1.4–7.7)
Neutrophils Relative %: 90 % — ABNORMAL HIGH (ref 43.0–77.0)
Platelets: 303 10*3/uL (ref 150.0–400.0)
RBC: 4.19 Mil/uL (ref 3.87–5.11)
RDW: 13.4 % (ref 11.5–15.5)
WBC: 6.6 10*3/uL (ref 4.0–10.5)

## 2015-06-09 LAB — COMPREHENSIVE METABOLIC PANEL
ALBUMIN: 4.6 g/dL (ref 3.5–5.2)
ALT: 17 U/L (ref 0–35)
AST: 14 U/L (ref 0–37)
Alkaline Phosphatase: 48 U/L (ref 39–117)
BUN: 11 mg/dL (ref 6–23)
CALCIUM: 9.5 mg/dL (ref 8.4–10.5)
CHLORIDE: 99 meq/L (ref 96–112)
CO2: 30 mEq/L (ref 19–32)
Creatinine, Ser: 0.7 mg/dL (ref 0.40–1.20)
GFR: 99.34 mL/min (ref >60.00)
Glucose, Bld: 94 mg/dL (ref 70–99)
POTASSIUM: 4.2 meq/L (ref 3.5–5.1)
SODIUM: 134 meq/L — AB (ref 135–145)
Total Bilirubin: 0.6 mg/dL (ref 0.2–1.2)
Total Protein: 6.7 g/dL (ref 6.0–8.3)

## 2015-06-09 LAB — HEPATITIS C ANTIBODY: HCV AB: NEGATIVE

## 2015-06-09 LAB — POCT INFLUENZA A/B
INFLUENZA B, POC: NEGATIVE
Influenza A, POC: POSITIVE — AB

## 2015-06-09 LAB — HIV ANTIBODY (ROUTINE TESTING W REFLEX): HIV: NONREACTIVE

## 2015-06-09 LAB — RHEUMATOID FACTOR: RHEUMATOID FACTOR: 11 [IU]/mL (ref <=14)

## 2015-06-09 LAB — IGA: IGA: 151 mg/dL (ref 68–378)

## 2015-06-09 LAB — CYCLIC CITRUL PEPTIDE ANTIBODY, IGG: Cyclic Citrullin Peptide Ab: <16 Units

## 2015-06-09 LAB — IGM: IgM, Serum: 112 mg/dL (ref 52–322)

## 2015-06-09 LAB — HEPATITIS B SURFACE ANTIGEN: Hepatitis B Surface Ag: NEGATIVE

## 2015-06-09 LAB — C-REACTIVE PROTEIN: CRP: 0.4 mg/dL — AB (ref 0.5–20.0)

## 2015-06-09 LAB — ANA: ANA: NEGATIVE

## 2015-06-09 MED ORDER — OSELTAMIVIR PHOSPHATE 75 MG PO CAPS: 75.0000 mg | ORAL_CAPSULE | Freq: Two times a day (BID) | ORAL | Status: DC

## 2015-06-09 NOTE — Progress Notes (Signed)
Subjective:    Patient ID: Sharon Trujillo, female    DOB: 01-15-1976,    MRN: [id number]  HPI  [id number] Vetnarian with seasonal rhinitis 2006 springtime controlled with clariton/allegra and freq sinus infections  referred to pulmonary clinic 06/09/2015 by Dr Kendrick FriesMcKowan   06/09/2015 1st [place] [facility] office visit/ Sharon Trujillo   Chief Complaint  Patient presents with  . Pulmonary Consult    Referred by Dr. Peter CongoKneuff. Pt c/o cough since No 2016- worse in the am. She has also had fever for the past 2 days. Cough this am was prod with light green sputum.    tick bite early Nov 2016, then w/in a week developed fever with rhinorrhea and cough which turned yellow and started abx at end of nov assoc L knee swellling and neg lyme > 30 days doxy > fever resolved, cough persisted ? Productive or not clear [facility] then first week in Jan 2017 > cough was worse more productive green mucus chest and nose and dx with LLL Pna 03/30/15   > doxy fever resolved and cxr clear  05/02/15 > persistent cough and mucus was white esp in am  but good ex tolerance > 05/30/14 cough worse with green nasal drainage rx with levaquin x 10 days and better until 05/29/15 recurrent fever, aching all over, mild ha / no appetite  > [facility]: Suspect new viral infection, possibly influenza. However, with relative frequency of respiratory infections the last few months, pt is highly in favor of a general lab eval to look into more atypical infections as well as possible rheumatologic/autoimmune etiology Therefore, will check CBC, CMET, CRP, ANA, Rh factor, CCP, Hep Bs Ag, Hep C antibody, HIV screen, RMSF IgM and IgG, and IgA/IgM/IgG testing> refer to pulmonary    No obvious other patterns in day to day or daytime variabilty or assoc   cp or chest tightness, subjective wheeze overt   hb symptoms. No unusual exp hx or h/o childhood pna/ asthma or knowledge of premature birth.    Also denies any obvious fluctuation of  symptoms with weather or environmental changes or other aggravating or alleviating factors except as outlined above   Current Medications, Allergies, Complete Past Medical History, Past Surgical History, Family History, and Social History were reviewed in [facility] electronic medical record.              Review of Systems  Constitutional: Negative for fever, chills and unexpected weight change.  HENT: Positive for congestion. Negative for dental problem, ear pain, nosebleeds, postnasal drip, rhinorrhea, sinus pressure, sneezing, sore throat, trouble swallowing and voice change.   Eyes: Negative for visual disturbance.  Respiratory: Positive for cough. Negative for choking and shortness of breath.   Cardiovascular: Negative for chest pain and leg swelling.  Gastrointestinal: Negative for vomiting, abdominal pain and diarrhea.  Genitourinary: Negative for difficulty urinating.  Musculoskeletal: Negative for arthralgias.  Skin: Negative for rash.  Neurological: Negative for tremors, syncope and headaches.  Hematological: Does not bruise/bleed easily.       Objective:   Physical Exam  amb wf nad   Wt Readings from Last 3 Encounters:  06/09/15 171 lb 6.4 oz (77.747 kg)  06/08/15 173 lb 12 oz (78.812 kg)  05/30/15 169 lb 1.9 oz (76.712 kg)    Vital signs reviewed   HEENT: nl dentition, turbinates, and oropharynx. Nl external ear canals without cough reflex   NECK :  without JVD/Nodes/TM/ nl  carotid upstrokes bilaterally   LUNGS: no acc muscle use,  Nl contour chest which is clear to A and P bilaterally without cough on insp or exp maneuvers   CV:  RRR  no s3 or murmur or increase in P2, no edema   ABD:  soft and nontender with nl inspiratory excursion in the supine position. No bruits or organomegaly, bowel sounds nl  MS:  Nl gait/ ext warm without deformities, calf tenderness, cyanosis or clubbing No obvious joint restrictions   SKIN: warm and dry without  lesions    NEURO:  alert, approp, nl sensorium with  no motor deficits    CXR PA and Lateral:   06/09/2015 :    I personally reviewed images and agree with radiology impression as follows:    Stable hyperinflation. No acute pulmonary findings     Assessment & Plan:

## 2015-06-09 NOTE — Patient Instructions (Addendum)
tamiflu if you want / fluids and rest   Please see patient coordinator before you leave today  to schedule sinus CT next week  If sinus ct is neg I would rec : For drainage / throat tickle try take CHLORPHENIRAMINE  4 mg - take one every 4 hours as needed - available over the counter- may cause drowsiness so start with just a bedtime dose or two and see how you tolerate it before trying in daytime    GERD (REFLUX)  is an extremely common cause of respiratory symptoms just like yours , many times with no obvious heartburn at all.    It can be treated with medication, but also with lifestyle changes including elevation of the head of your bed (ideally with 6 inch  bed blocks),  Smoking cessation, avoidance of late meals, excessive alcohol, and avoid fatty foods, chocolate, peppermint, colas, red wine, and acidic juices such as orange juice.  NO MINT OR MENTHOL PRODUCTS SO NO COUGH DROPS  USE SUGARLESS CANDY INSTEAD (Jolley ranchers or Stover's or Life Savers) or even ice chips will also do - the key is to swallow to prevent all throat clearing. NO OIL BASED VITAMINS - use powdered substitutes.   Try prilosec otc 20mg   Take 30-60 min before first meal of the day and Pepcid ac (famotidine) 20 mg one @  bedtime until cough is completely gone for at least a week without the need for cough suppression  Please remember to go to the   x-ray department downstairs for your tests - we will call you with the results when they are available.

## 2015-06-09 NOTE — Progress Notes (Signed)
Quick Note:  Spoke with pt and notified of results per Dr. Wert. Pt verbalized understanding and denied any questions.  ______ 

## 2015-06-10 ENCOUNTER — Telehealth: Payer: Self-pay | Admitting: *Deleted

## 2015-06-10 LAB — IGG 1, 2, 3, AND 4: IgG (Immunoglobin G), Serum: 639 mg/dL — ABNORMAL LOW (ref 690–1700)

## 2015-06-10 NOTE — Telephone Encounter (Signed)
[facility] on 06/10/15 at 12:21pm requesting call back in regards to Ref# [id number].  I spoke to [place] and she stated that they were unable to do the IgG subclass lab due to no red top drawn/sent.

## 2015-06-10 NOTE — Telephone Encounter (Signed)
FYI

## 2015-06-13 ENCOUNTER — Telehealth: Payer: Self-pay | Admitting: Internal Medicine

## 2015-06-13 DIAGNOSIS — J09X2 Influenza due to identified novel influenza A virus with other respiratory manifestations: Secondary | ICD-10-CM | POA: Insufficient documentation

## 2015-06-13 LAB — ROCKY MTN SPOTTED FVR ABS PNL(IGG+IGM)
RMSF IGM: 0.37 IV
RMSF IgG: 0.26 IV

## 2015-06-13 NOTE — Assessment & Plan Note (Addendum)
Test Pos 06/09/2015 > rx tamiflu  Bu  Explained this is obviously not the likely cause of her chronic/recurrent cough symptoms dating back to November and tamiflu may only shorten illness by one day and main rx is fluids/ rest/ look for secondary infections  Discussed in detail all the  indications, usual  risks and alternatives  relative to the benefits with patient who agrees to proceed with conservative f/u as outlined

## 2015-06-13 NOTE — Telephone Encounter (Signed)
LMTCB

## 2015-06-13 NOTE — Assessment & Plan Note (Signed)
The most common causes of chronic cough in immunocompetent adults include the following: upper airway cough syndrome (UACS), previously referred to as postnasal drip syndrome (PNDS), which is caused by variety of rhinosinus conditions; (2) asthma; (3) GERD; (4) chronic bronchitis from cigarette smoking or other inhaled environmental irritants; (5) nonasthmatic eosinophilic bronchitis; and (6) bronchiectasis.   These conditions, singly or in combination, have accounted for up to 94% of the causes of chronic cough in prospective studies.   Other conditions have constituted no >6% of the causes in prospective studies These have included bronchogenic carcinoma, chronic interstitial pneumonia, sarcoidosis, left ventricular failure, ACEI-induced cough, and aspiration from a condition associated with pharyngeal dysfunction.    Chronic cough is often simultaneously caused by more than one condition. A single cause has been found from 38 to 82% of the time, multiple causes from 18 to 62%. Multiply caused cough has been the result of three diseases up to 42% of the time.       Based on hx and exam, this is most likely:  Classic Upper airway cough syndrome, so named because it's frequently impossible to sort out how much is  CR/sinusitis with freq throat clearing (which can be related to primary GERD)   vs  causing  secondary (" extra esophageal")  GERD from wide swings in gastric pressure that occur with throat clearing, often  promoting self use of mint and menthol lozenges that reduce the lower esophageal sphincter tone and exacerbate the problem further in a cyclical fashion.   These are the same pts (now being labeled as having "irritable larynx syndrome" by some cough centers) who not infrequently have a history of having failed to tolerate ace inhibitors,  dry powder inhalers or biphosphonates or report having atypical reflux symptoms that don't respond to standard doses of PPI , and are easily confused as  having aecopd or asthma flares by even experienced allergists/ pulmonologists.   The first step is to maximize acid suppression and eliminate cyclical coughing and eval for possible underlying sinus dz then return if not better.   I had an extended discussion with the patient reviewing all relevant studies completed to date and  lasting 35 min  1)  Explained the natural history of uri and why it's necessary in patients at risk to treat GERD aggressively - at least  short term -   to reduce risk of evolving cyclical cough initially  triggered by epithelial injury and a heightened sensitivty to the effects of any upper airway irritants,  most importantly acid - related - then perpetuated by epithelial injury related to the cough itself as the upper airway collapses on itself.  That is, the more sensitive the epithelium becomes once it is damaged by the virus, the more the ensuing irritability> the more the cough, the more the secondary reflux (especially in those prone to reflux) the more the irritation of the sensitive mucosa and so on in a  Classic cyclical pattern.     2) Each maintenance medication was reviewed in detail including most importantly the difference between maintenance and prns and under what circumstances the prns are to be triggered using an action plan format that is not reflected in the computer generated alphabetically organized AVS.    Please see instructions for details which were reviewed in writing and the patient given a copy highlighting the part that I personally wrote and discussed at today's ov.   See instructions for specific recommendations which were reviewed directly with the  patient who was given a copy with highlighter outlining the key components.

## 2015-06-14 ENCOUNTER — Ambulatory Visit (INDEPENDENT_AMBULATORY_CARE_PROVIDER_SITE_OTHER)
Admission: RE | Admit: 2015-06-14 | Discharge: 2015-06-14 | Disposition: A | Discharge status: Home / Self Care | Payer: 59 | Source: Ambulatory Visit | Attending: Internal Medicine | Admitting: Internal Medicine

## 2015-06-14 ENCOUNTER — Other Ambulatory Visit: Payer: Self-pay | Admitting: Internal Medicine

## 2015-06-14 DIAGNOSIS — R053 Chronic cough: Secondary | ICD-10-CM

## 2015-06-14 DIAGNOSIS — R05 Cough: Secondary | ICD-10-CM

## 2015-06-14 NOTE — Telephone Encounter (Signed)
Spoke with pt. States that she wants to know if MW thinks this medical necessary to do. Pt wants MW's guidance on this.  MW - please advise. Thanks.

## 2015-06-14 NOTE — Telephone Encounter (Signed)
Discussed with pt/ hold off on gerd w/u

## 2015-06-14 NOTE — Progress Notes (Signed)
Quick Note:  Spoke with the Sharon Trujillo and notified of results and recs per Dr Sherene SiresWert She is refusing to take more levaquin since she just finished taking this ______

## 2015-06-14 NOTE — Telephone Encounter (Signed)
if she wants to find out whether has a HH we could do a Dg Esophagram prior to her next ov then discuss the implications and management options then   Note we did address the GERD issue with the instructions and the worst possible side effect of a HH is GERD, which we're treating, so no change rx for now

## 2015-06-14 NOTE — Telephone Encounter (Signed)
Pt returned Leslie's call, CB is [phone]

## 2015-06-14 NOTE — Telephone Encounter (Signed)
Spoke with pt, states her chiropractor suspects that she had a hiatal hernia which was causing her increased GERD symptoms while pregnant- pt states these issues are now resolved after pregnancy, but pt wonders if this could be contributing to her chronic cough.  If so, pt would like to know how we can look into seeing if she does have a hiatal hernia, and what can be done about this.  Pt also notes she had a ct sinus yesterday.    MW please advise on recs.  Thanks!   Patient Instructions       tamiflu if you want / fluids and rest   Please see patient coordinator before you leave today  to schedule sinus CT next week   If sinus ct is neg I would rec : For drainage / throat tickle try take CHLORPHENIRAMINE  4 mg - take one every 4 hours as needed - available over the counter- may cause drowsiness so start with just a bedtime dose or two and see how you tolerate it before trying in daytime    GERD (REFLUX)  is an extremely common cause of respiratory symptoms just like yours , many times with no obvious heartburn at all.    It can be treated with medication, but also with lifestyle changes including elevation of the head of your bed (ideally with 6 inch  bed blocks),  Smoking cessation, avoidance of late meals, excessive alcohol, and avoid fatty foods, chocolate, peppermint, colas, red wine, and acidic juices such as orange juice.   NO MINT OR MENTHOL PRODUCTS SO NO COUGH DROPS   USE SUGARLESS CANDY INSTEAD (Jolley ranchers or Stover's or Life Savers) or even ice chips will also do - the key is to swallow to prevent all throat clearing. NO OIL BASED VITAMINS - use powdered substitutes.   Try prilosec otc 20mg   Take 30-60 min before first meal of the day and Pepcid ac (famotidine) 20 mg one @  bedtime until cough is completely gone for at least a week without the need for cough suppression  Please remember to go to the   x-ray department downstairs for your tests - we will call you with the results  when they are available.

## 2015-06-16 ENCOUNTER — Telehealth: Payer: Self-pay | Admitting: Internal Medicine

## 2015-06-16 MED ORDER — CEFDINIR 300 MG PO CAPS: 300.0000 mg | ORAL_CAPSULE | Freq: Two times a day (BID) | ORAL | Status: DC

## 2015-06-16 NOTE — Telephone Encounter (Signed)
Patient is scheduled for ENT appt 07/08/15. Pt is requesting an abx until she is seen.  C/o green mucus from nose, sinus pressure/head congestion and cough unchanged since last OV. Denies fever.  Reports phlegm from chest is off-white/light yellow. Please advise Dr Sherene SiresWert. Thanks.   Notes Recorded by Christen ButterLeslie M Raskin, CMA on 06/14/2015 at 10:45 AM Spoke with the pt and notified of results and recs per Dr Sherene SiresWert She is refusing to take more levaquin since she just finished taking this Notes Recorded by Nyoka CowdenMichael B Wert, MD on 06/14/2015 at 10:31 AM Call patient : Study is c/w bilateral max sinusitis. Since listed as pcn allergic best option is levaquin 500 x 10 days and unless 100% feeling better at that point needs ent eval rather than just continuing more abx  Allergies  Allergen Reactions  . Albuterol Shortness Of Breath    ?rebound bronchospasm when used q3-4 hours?  . Codeine Itching and Nausea And Vomiting    Can tolerate Tramadol  . Penicillins Hives and Swelling  . Sulfa Antibiotics Swelling  . Trimethoprim Swelling

## 2015-06-16 NOTE — Telephone Encounter (Signed)
Rx sent to pharmacy per pt request. Aware PCN allergy - pt to let us know if any reaction to antibiotic.  Nothing further needed.

## 2015-06-16 NOTE — Telephone Encounter (Signed)
omnicef 300 bid x 10days > aware of pcn allergy

## 2015-06-17 ENCOUNTER — Telehealth: Payer: Self-pay

## 2015-06-17 NOTE — Telephone Encounter (Signed)
-----   Message from Sharon MassedPhilip H McGowen, MD sent at 06/13/2015  6:24 PM EDT ----- Pls notify pt that the remainder of her blood tests came back normal.  Pls see how she's feeling.-thx

## 2015-06-17 NOTE — Telephone Encounter (Signed)
Called patient and confirmed.

## 2015-06-17 NOTE — Telephone Encounter (Signed)
Gave lab results. Patient says she is having major sinus pressure, a productive cough with a lot of green-phlegm, and congestion.  Dr. Sherene SiresWert referred her to ENT. She has an appt on 07/08/15. Patient states if Dr. Milinda CaveMcGowen would like to see her before then to just let her know.

## 2015-06-17 NOTE — Telephone Encounter (Signed)
I don't need to see her before then.-thx

## 2015-07-15 DIAGNOSIS — J302 Other seasonal allergic rhinitis: Secondary | ICD-10-CM | POA: Insufficient documentation

## 2015-07-15 DIAGNOSIS — K9041 Non-celiac gluten sensitivity: Secondary | ICD-10-CM | POA: Insufficient documentation

## 2015-07-15 DIAGNOSIS — K219 Gastro-esophageal reflux disease without esophagitis: Secondary | ICD-10-CM | POA: Insufficient documentation

## 2015-09-23 ENCOUNTER — Telehealth: Payer: Self-pay | Admitting: Gastroenterology

## 2015-09-23 NOTE — Telephone Encounter (Signed)
Patient notified that at this time this is the earliest appt I have with a provider.  I advised her I will keep her name and if we get an earlier opening we will give her a call

## 2015-10-12 ENCOUNTER — Ambulatory Visit: Payer: 59 | Admitting: Gastroenterology

## 2015-11-03 ENCOUNTER — Other Ambulatory Visit: Payer: Self-pay | Admitting: Gastroenterology

## 2015-11-03 DIAGNOSIS — R059 Cough, unspecified: Secondary | ICD-10-CM

## 2015-11-03 DIAGNOSIS — R05 Cough: Secondary | ICD-10-CM

## 2015-11-03 DIAGNOSIS — IMO0001 Reserved for inherently not codable concepts without codable children: Secondary | ICD-10-CM

## 2015-11-03 DIAGNOSIS — R1110 Vomiting, unspecified: Secondary | ICD-10-CM

## 2015-11-08 ENCOUNTER — Other Ambulatory Visit: Payer: 59

## 2015-11-10 ENCOUNTER — Ambulatory Visit
Admission: RE | Admit: 2015-11-10 | Discharge: 2015-11-10 | Disposition: A | Discharge status: Home / Self Care | Payer: 59 | Source: Ambulatory Visit | Attending: Gastroenterology | Admitting: Gastroenterology

## 2015-11-10 DIAGNOSIS — R059 Cough, unspecified: Secondary | ICD-10-CM

## 2015-11-10 DIAGNOSIS — R05 Cough: Secondary | ICD-10-CM

## 2015-11-10 DIAGNOSIS — IMO0001 Reserved for inherently not codable concepts without codable children: Secondary | ICD-10-CM

## 2015-11-10 DIAGNOSIS — R1110 Vomiting, unspecified: Secondary | ICD-10-CM

## 2016-01-13 ENCOUNTER — Emergency Department (HOSPITAL_BASED_OUTPATIENT_CLINIC_OR_DEPARTMENT_OTHER): Payer: 59

## 2016-01-13 ENCOUNTER — Emergency Department (HOSPITAL_BASED_OUTPATIENT_CLINIC_OR_DEPARTMENT_OTHER)
Admission: EM | Admit: 2016-01-13 | Discharge: 2016-01-13 | Disposition: A | Discharge status: Home / Self Care | Payer: 59 | Attending: Emergency Medicine | Admitting: Emergency Medicine

## 2016-01-13 ENCOUNTER — Encounter (HOSPITAL_BASED_OUTPATIENT_CLINIC_OR_DEPARTMENT_OTHER): Payer: Self-pay | Admitting: *Deleted

## 2016-01-13 ENCOUNTER — Telehealth: Payer: Self-pay | Admitting: Family Medicine

## 2016-01-13 DIAGNOSIS — M545 Low back pain, unspecified: Secondary | ICD-10-CM

## 2016-01-13 DIAGNOSIS — Z79899 Other long term (current) drug therapy: Secondary | ICD-10-CM | POA: Insufficient documentation

## 2016-01-13 DIAGNOSIS — N390 Urinary tract infection, site not specified: Secondary | ICD-10-CM | POA: Insufficient documentation

## 2016-01-13 DIAGNOSIS — R319 Hematuria, unspecified: Secondary | ICD-10-CM

## 2016-01-13 LAB — LIPASE, BLOOD: LIPASE: 24 U/L (ref 11–51)

## 2016-01-13 LAB — URINALYSIS, ROUTINE W REFLEX MICROSCOPIC
BILIRUBIN URINE: NEGATIVE
Glucose, UA: NEGATIVE mg/dL
KETONES UR: NEGATIVE mg/dL
NITRITE: NEGATIVE
PROTEIN: 30 mg/dL — AB
Specific Gravity, Urine: 1.015 (ref 1.005–1.030)
pH: 6 (ref 5.0–8.0)

## 2016-01-13 LAB — CBC WITH DIFFERENTIAL/PLATELET
BASOS ABS: 0 10*3/uL (ref 0.0–0.1)
Basophils Relative: 0 %
EOS ABS: 0.1 10*3/uL (ref 0.0–0.7)
EOS PCT: 2 %
HCT: 36.7 % (ref 36.0–46.0)
Hemoglobin: 12.4 g/dL (ref 12.0–15.0)
Lymphocytes Relative: 23 %
Lymphs Abs: 1 10*3/uL (ref 0.7–4.0)
MCH: 31.4 pg (ref 26.0–34.0)
MCHC: 33.8 g/dL (ref 30.0–36.0)
MCV: 92.9 fL (ref 78.0–100.0)
Monocytes Absolute: 0.5 10*3/uL (ref 0.1–1.0)
Monocytes Relative: 11 %
Neutro Abs: 2.9 10*3/uL (ref 1.7–7.7)
Neutrophils Relative %: 64 %
PLATELETS: 195 10*3/uL (ref 150–400)
RBC: 3.95 MIL/uL (ref 3.87–5.11)
RDW: 12 % (ref 11.5–15.5)
WBC: 4.4 10*3/uL (ref 4.0–10.5)

## 2016-01-13 LAB — COMPREHENSIVE METABOLIC PANEL
ALT: 13 U/L — AB (ref 14–54)
AST: 14 U/L — ABNORMAL LOW (ref 15–41)
Albumin: 4.3 g/dL (ref 3.5–5.0)
Alkaline Phosphatase: 36 U/L — ABNORMAL LOW (ref 38–126)
Anion gap: 6 (ref 5–15)
BUN: 10 mg/dL (ref 6–20)
CHLORIDE: 103 mmol/L (ref 101–111)
CO2: 25 mmol/L (ref 22–32)
CREATININE: 0.65 mg/dL (ref 0.44–1.00)
Calcium: 9.2 mg/dL (ref 8.9–10.3)
GFR calc non Af Amer: >60 mL/min (ref >60)
Glucose, Bld: 116 mg/dL — ABNORMAL HIGH (ref 65–99)
Potassium: 3.8 mmol/L (ref 3.5–5.1)
SODIUM: 134 mmol/L — AB (ref 135–145)
Total Bilirubin: 1.4 mg/dL — ABNORMAL HIGH (ref 0.3–1.2)
Total Protein: 6.4 g/dL — ABNORMAL LOW (ref 6.5–8.1)

## 2016-01-13 LAB — URINE MICROSCOPIC-ADD ON

## 2016-01-13 LAB — I-STAT CG4 LACTIC ACID, ED: Lactic Acid, Venous: 1.26 mmol/L (ref 0.5–1.9)

## 2016-01-13 LAB — PREGNANCY, URINE: PREG TEST UR: NEGATIVE

## 2016-01-13 MED ORDER — SODIUM CHLORIDE 0.9 % IV BOLUS (SEPSIS)
1000.0000 mL | Freq: Once | INTRAVENOUS | Status: AC
  Administered 2016-01-13: 1000 mL via INTRAVENOUS

## 2016-01-13 MED ORDER — FOSFOMYCIN TROMETHAMINE 3 G PO PACK
3.0000 g | PACK | Freq: Once | ORAL | Status: AC
  Administered 2016-01-13: 3 g via ORAL
  Filled 2016-01-13: qty 3

## 2016-01-13 NOTE — ED Provider Notes (Signed)
[facility] Provider Note   CSN: [id number] Arrival date & time: 01/13/16  1122     History   Chief Complaint Chief Complaint  Patient presents with  . Back Pain  . Hematuria    HPI ROBI MITTER is a 40 y.o. 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 Patient reports that her flank pain began yesterday and her hematuria started this morning. She says that she has been having intermittent fevers for the last few days which she is taking antipyretics for. She says that several days ago she had some left-sided headache which is resolved and some hand tingling which is resolved. She denies current headache, vision changes, nausea, vomiting, or any neurologic complaints. She says that she has been riding horses more often recently and thinks she may have a muscular component to her left flank pain. She describes it as moderate in severity and radiating from the left back in the left lower quadrant. She does not know of any specific trauma. She does not have any history of kidney stones. She denies dysuria but does report clots of blood in her urine. She denies any neck pain or neck stiffness. She denies any rhinorrhea, congestion, or cough. She denies any bowel symptoms such as constipation, diarrhea, or any other complaints.  She does report that one of her colleagues recently had [facility] spotted fever. She denies any recent tick exposures or rashes.     Hematuria  This is a new problem. The current episode started 12 to 24 hours ago. The problem occurs constantly. The problem has not changed since onset.Associated symptoms include headaches (mild, resolved). Pertinent negatives include no chest pain, no abdominal pain and no shortness of breath. Nothing aggravates the symptoms. Nothing relieves the symptoms. She has tried nothing for the symptoms. The treatment provided no relief.    Flank Pain  This is a new problem. The current episode started yesterday. The problem occurs daily. The problem has been gradually improving. Associated symptoms include headaches (mild, resolved). Pertinent negatives include no chest pain, no abdominal pain and no shortness of breath. The symptoms are aggravated by twisting. Nothing relieves the symptoms. She has tried nothing for the symptoms. The treatment provided no relief.    Past Medical History:  Diagnosis Date  . Dermatitis herpetiformis 2015/16   Celiac blood tests negative; skin bx neg for dermatitis herpetiformis dx  . GERD (gastroesophageal reflux disease)    pregnancy only  . Gestational hypertension without significant proteinuria in third trimester 11/09/2010  . HSV (herpes simplex virus) anogenital infection   . Lactose intolerance   . Palpitations    stress related palpitations  . Psoriasis    occ left knee pain WITHOUT swelling--resolves with use of CLARITIN (??.)  No hx suspicious for psoriatic arthritis.  . Vitamin D deficiency     Patient Active Problem List   Diagnosis Date Noted  . Influenza due to identified novel influenza A virus with other respiratory manifestations 06/13/2015  . Chronic cough 05/02/2015  . LLL pneumonia (HCC) 03/31/2015  . Tick bite of scalp 03/29/2015  . CAP (community acquired pneumonia) 01/15/2014  . Respiratory infection 01/06/2014  . Wheezing 12/25/2013    Past Surgical History:  Procedure Laterality Date  . CESAREAN SECTION  11/09/2010   Procedure: CESAREAN SECTION;  Surgeon: Lenoard Aden, MD;  Location: [facility];  Service: Gynecology;  Laterality: N/A;  Primary Cesarean  Section with birth of baby boy @ 2316  . CESAREAN SECTION N/A 01/23/2013   Procedure: CESAREAN SECTION;  Surgeon: Lenoard Adenichard J Taavon, MD;  Location: [facility];  Service: Obstetrics;  Laterality: N/A;  . COLONOSCOPY  04/2008   done for hematochezia (normal).  Needs screening TCS age 40  . DILATION AND EVACUATION   01/16/2012   Procedure: DILATATION AND EVACUATION;  Surgeon: Lenoard Adenichard J Taavon, MD;  Location: [facility];  Service: Gynecology;  Laterality: N/A;  . FEMORAL HERNIA REPAIR  2009   left  . LEEP  2005    OB History    Gravida Para Term Preterm AB Living   3 2 2   1 1    SAB TAB Ectopic Multiple Live Births   1       1       Home Medications    Prior to Admission medications   Medication Sig Start Date End Date Taking? Authorizing Provider  clobetasol (OLUX) 0.05 % topical foam Apply 1 application topically daily as needed. 01/31/15  Yes Historical Provider, MD  fexofenadine (ALLEGRA) 180 MG tablet Take 180 mg by mouth daily.   Yes Historical Provider, MD  triamcinolone cream (KENALOG) 0.1 % Apply 1 application topically as needed.    Yes Historical Provider, MD  valACYclovir (VALTREX) 500 MG tablet Take 500 mg by mouth 2 (two) times daily as needed.    Yes Historical Provider, MD  cefdinir (OMNICEF) 300 MG capsule Take 1 capsule (300 mg total) by mouth 2 (two) times daily. 06/16/15   Nyoka CowdenMichael B Wert, MD  oseltamivir (TAMIFLU) 75 MG capsule Take 1 capsule (75 mg total) by mouth 2 (two) times daily. 06/09/15   Nyoka CowdenMichael B Wert, MD  Probiotic Product (PROBIOTIC DAILY PO) Take by mouth.    Historical Provider, MD    Family History Family History  Problem Relation Age of Onset  . Cancer Maternal Grandmother   . Stroke Maternal Grandmother   . Cancer Paternal Grandfather   . Arthritis Mother   . Hyperlipidemia Mother   . Hypertension Maternal Uncle   . Mental retardation Paternal Aunt   . Heart disease Paternal Grandmother     Social History Social History  Substance Use Topics  . Smoking status: Never Smoker  . Smokeless tobacco: Never Used  . Alcohol use No     Allergies   Albuterol; Codeine; Penicillins; Sulfa antibiotics; and Trimethoprim   Review of Systems Review of Systems  Constitutional: Positive for fever. Negative for appetite change, chills, diaphoresis and fatigue.    HENT: Negative for congestion and rhinorrhea.   Eyes: Negative for photophobia and visual disturbance.  Respiratory: Negative for cough, choking, chest tightness, shortness of breath, wheezing and stridor.   Cardiovascular: Negative for chest pain, palpitations and leg swelling.  Gastrointestinal: Negative for abdominal distention, abdominal pain, blood in stool, constipation, diarrhea, nausea and vomiting.  Endocrine: Negative for polyuria.  Genitourinary: Positive for flank pain and hematuria. Negative for decreased urine volume, difficulty urinating, dysuria, frequency, pelvic pain, vaginal bleeding, vaginal discharge and vaginal pain.  Musculoskeletal: Positive for back pain. Negative for neck pain and neck stiffness.  Skin: Negative for color change, rash and wound.  Neurological: Positive for headaches (mild, resolved). Negative for dizziness, seizures, weakness, light-headedness and numbness (tingling in fingers, resolved).  Hematological: Does not bruise/bleed easily.  Psychiatric/Behavioral: Negative for confusion.  All other systems reviewed and are negative.    Physical Exam Updated Vital Signs BP 124/84   Pulse 84  Temp 98 F (36.7 C) (Oral)   Resp 20   Ht 5' 10.75" (1.797 m)   Wt 170 lb (77.1 kg)   LMP 12/30/2015   SpO2 99%   BMI 23.88 kg/m   Physical Exam  Constitutional: She is oriented to person, place, and time. She appears well-developed and well-nourished. No distress.  HENT:  Head: Normocephalic and atraumatic.  Nose: Nose normal.  Mouth/Throat: Oropharynx is clear and moist. No oropharyngeal exudate.  Eyes: Conjunctivae and EOM are normal. Pupils are equal, round, and reactive to light.  Neck: Normal range of motion. Neck supple.  Cardiovascular: Normal rate, regular rhythm, normal heart sounds and intact distal pulses.   No murmur heard. Pulmonary/Chest: Effort normal and breath sounds normal. No stridor. No respiratory distress. She has no rales. She  exhibits no tenderness.  Abdominal: Soft. Normal appearance and bowel sounds are normal. There is tenderness in the left lower quadrant. There is no rigidity and no CVA tenderness.    Musculoskeletal: She exhibits tenderness. She exhibits no edema.       Lumbar back: She exhibits tenderness and pain. She exhibits no deformity, no laceration and no spasm.       Back:  Neurological: She is alert and oriented to person, place, and time. She has normal reflexes. She is not disoriented. She displays no tremor and normal reflexes. No cranial nerve deficit or sensory deficit. She exhibits normal muscle tone. Coordination and gait normal. GCS eye subscore is 4. GCS verbal subscore is 5. GCS motor subscore is 6.  Normal strength, sensation, gate.  Skin: Skin is warm and dry. Capillary refill takes less than 2 seconds. No rash noted. She is not diaphoretic. No erythema.  Psychiatric: She has a normal mood and affect.  Nursing note and vitals reviewed.    ED Treatments / Results  Labs (all labs ordered are listed, but only abnormal results are displayed) Labs Reviewed  URINALYSIS, ROUTINE W REFLEX MICROSCOPIC (NOT AT [facility]) - Abnormal; Notable for the following:       Result Value   Color, Urine RED (*)    APPearance CLOUDY (*)    Hgb urine dipstick LARGE (*)    Protein, ur 30 (*)    Leukocytes, UA TRACE (*)    All other components within normal limits  COMPREHENSIVE METABOLIC PANEL - Abnormal; Notable for the following:    Sodium 134 (*)    Glucose, Bld 116 (*)    Total Protein 6.4 (*)    AST 14 (*)    ALT 13 (*)    Alkaline Phosphatase 36 (*)    Total Bilirubin 1.4 (*)    All other components within normal limits  URINE MICROSCOPIC-ADD ON - Abnormal; Notable for the following:    Squamous Epithelial / LPF 0-5 (*)    Bacteria, UA RARE (*)    All other components within normal limits  URINE CULTURE  PREGNANCY, URINE  CBC WITH DIFFERENTIAL/PLATELET  LIPASE, BLOOD  I-STAT CG4 LACTIC  ACID, ED  I-STAT CG4 LACTIC ACID, ED    EKG  EKG Interpretation None       Radiology Ct Renal Stone Study  Result Date: 01/13/2016 CLINICAL DATA:  Left flank pain, hematuria EXAM: CT ABDOMEN AND PELVIS WITHOUT CONTRAST TECHNIQUE: Multidetector CT imaging of the abdomen and pelvis was performed following the standard protocol without IV contrast. COMPARISON:  None. FINDINGS: Lower chest: The lung bases are unremarkable. Hepatobiliary: Unenhanced liver shows no biliary ductal dilatation. No calcified gallstones are  noted within gallbladder. Pancreas: Unenhanced pancreas is unremarkable. Spleen: Unenhanced spleen is unremarkable. Adrenals/Urinary Tract: No adrenal gland mass. No nephrolithiasis. No hydronephrosis or hydroureter. No calcified ureteral calculi. Bilateral distal ureter is unremarkable. No calcified calculi are noted within urinary bladder. Stomach/Bowel: There is no small bowel obstruction. No thickened or dilated small bowel loops. Normal appendix noted in coronal image 45. No pericecal inflammation. No colonic obstruction. Vascular/Lymphatic: No aortic aneurysm. No retroperitoneal or mesenteric adenopathy. Reproductive: The uterus is anteflexed.  No adnexal masses noted. Other: No ascites or free abdominal air.  No inguinal adenopathy. Musculoskeletal: Sagittal images of the spine shows mild degenerative changes lower thoracic spine. No destructive bony lesions are noted. Mild anterior spurring upper endplate of L4 and L5 vertebral body. IMPRESSION: 1. No nephrolithiasis.  No hydronephrosis or hydroureter. 2. No calcified ureteral calculi. No calcified calculi are noted within urinary bladder. 3. Normal appendix.  No pericecal inflammation. 4. No small bowel obstruction. Electronically Signed   By: Natasha MeadLiviu  Pop M.D.   On: 01/13/2016 14:40    Procedures Procedures (including critical care time)  Medications Ordered in ED Medications  sodium chloride 0.9 % bolus 1,000 mL (0 mLs  Intravenous Stopped 01/13/16 1529)  fosfomycin (MONUROL) packet 3 g (3 g Oral Given 01/13/16 1532)     Initial Impression / Assessment and Plan / ED Course  I have reviewed the triage vital signs and the nursing notes.  Pertinent labs & imaging results that were available during my care of the patient were reviewed by me and considered in my medical decision making (see chart for details).  Clinical Course   Deberah Castlelaine M Nosal is a 40 y.o. 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  History and exam are seen above.  Exam only significant for mild left flank tenderness. No tenderness in the left CVA area. Normal range of motion of left hip. Normal neurologic exam. Lungs clear. Abdomen nontender aside from left flank area.  Given symptoms of flank pain, hematuria, initial consideration is for kidney stone versus UTI. Given recent horseback riding, renal trauma or musculoskeletal pain considered as etiology. Patient had laboratory testing, urinalysis, and imaging to evaluate.  Lab tests showed normal lactic acid, normal lipase, Unremarkable CBC with no leukocytosis or anemia.Metabolic panel grossly unremarkable. Urinalysis showed large blood, leukocytes, and some bacteria. Pregnancy Test negative.  CT scan showed no evidence of kidney stone. No etiology of patient's pain was found on imaging.  Given bacteria and hematuria and leukocytes, patient will be treated for possible UTI. Patient given one dose of fosfomycin given her allergies to penicillin and sulfa. Urine culture was sent.  Patient also told that musculoskeletal pain may be cause of symptoms. Patient may also have viral infection causing fevers and muscular pains. Do not feel patient has meningitis based on reassuring physical exam findings. Given the lack of rash and reassuring bloodwork, do not feel patient has [facility] fever.   Patient  instructed to follow up with PCP for monitoring of hematuria and for further symptom management. Patient given strict return precautions for any new or worsening symptoms. Patient had no other questions or concerns and patient was discharged in good condition.     Final Clinical Impressions(s) / ED Diagnoses   Final diagnoses:  Lower urinary tract infectious disease  Acute left-sided low back pain without sciatica  Hematuria, unspecified type    New Prescriptions Discharge Medication List  as of 01/13/2016  3:09 PM     Clinical Impression: 1. Lower urinary tract infectious disease   2. Acute left-sided low back pain without sciatica   3. Hematuria, unspecified type     Disposition: Discharge  Condition: Good  I have discussed the results, Dx and Tx plan with the pt(& family if present). He/she/they expressed understanding and agree(s) with the plan. Discharge instructions discussed at great length. Strict return precautions discussed and pt &/or family have verbalized understanding of the instructions. No further questions at time of discharge.    Discharge Medication List as of 01/13/2016  3:09 PM      Follow Up: Jeoffrey Massed, MD [address] [place] [address] [phone]  Schedule an appointment as soon as possible for a visit    [facility] [place] [facility]ENCY DEPARTMENT [address] [id number] mc [address] [place] [address] [phone]  If symptoms worsen     Heide Scales, MD 01/13/16 2040

## 2016-01-13 NOTE — Telephone Encounter (Signed)
[facility] Day - Client TELEPHONE ADVICE RECORD [facility] Patient Name: Sharon Trujillo DOB: Aug 14, 1975 Initial Comment caller states she has a fever 100.4 Nurse Assessment Nurse: Debera Latalston, RN, Tinnie GensJeffrey Date/Time Lamount Cohen(Eastern Time): 01/13/2016 9:29:56 AM Confirm and document reason for call. If symptomatic, describe symptoms. You must click the next button to save text entered. ---Caller states she has a fever 100.4. Fever since Tuesday. Highest fever of 103.4. Has the patient traveled out of the country within the last 30 days? ---No Does the patient have any new or worsening symptoms? ---Yes Will a triage be completed? ---Yes Related visit to physician within the last 2 weeks? ---No Does the PT have any chronic conditions? (i.e. diabetes, asthma, etc.) ---No Is the patient pregnant or possibly pregnant? (Ask all females between the ages of 6812-55) ---No Is this a behavioral health or substance abuse call? ---No Guidelines Guideline Title Affirmed Question Affirmed Notes Fever Fever present > 3 days (72 hours) Final Disposition User See Physician within 24 Hours Candler-McAfeeRalston, RN, Tinnie GensJeffrey Comments [facility], [facility], and [facility] and none available for today. Advised caller that she could go to [facility] or call back to [facility] office after noon for possible appointment in [facility]. Referrals REFERRED TO PCP OFFICE GO TO FACILITY UNDECIDED Disagree/Comply: Comply

## 2016-01-13 NOTE — Telephone Encounter (Signed)
Contacted patient, after speaking with patient I advised her that she should go to Urgent care or ED due to additional symptoms including passing blood clots through urine and numbness / tingling sensation in fingers.  Patient agreed.

## 2016-01-13 NOTE — Telephone Encounter (Signed)
Tried to contact patient, I found openings at [place] and [place] for pt to be seen.

## 2016-01-13 NOTE — ED Triage Notes (Signed)
Lower back pain. Hematuria last night. Fever over the weekend.  Tingling in her arms and legs before the fever. Left sided head pain. Those symptoms went away.

## 2016-01-14 LAB — URINE CULTURE

## 2016-01-14 NOTE — Telephone Encounter (Signed)
Noted agree

## 2016-02-05 ENCOUNTER — Encounter (HOSPITAL_BASED_OUTPATIENT_CLINIC_OR_DEPARTMENT_OTHER): Payer: Self-pay | Admitting: Emergency Medicine

## 2016-02-05 ENCOUNTER — Emergency Department (HOSPITAL_BASED_OUTPATIENT_CLINIC_OR_DEPARTMENT_OTHER)
Admission: EM | Admit: 2016-02-05 | Discharge: 2016-02-05 | Disposition: A | Discharge status: Home / Self Care | Payer: 59 | Attending: Emergency Medicine | Admitting: Emergency Medicine

## 2016-02-05 DIAGNOSIS — R519 Headache, unspecified: Secondary | ICD-10-CM

## 2016-02-05 DIAGNOSIS — R51 Headache: Secondary | ICD-10-CM | POA: Diagnosis not present

## 2016-02-05 DIAGNOSIS — Z79899 Other long term (current) drug therapy: Secondary | ICD-10-CM | POA: Insufficient documentation

## 2016-02-05 DIAGNOSIS — R509 Fever, unspecified: Secondary | ICD-10-CM | POA: Diagnosis present

## 2016-02-05 LAB — CBC WITH DIFFERENTIAL/PLATELET
Basophils Absolute: 0 10*3/uL (ref 0.0–0.1)
Basophils Relative: 0 %
EOS ABS: 0.1 10*3/uL (ref 0.0–0.7)
Eosinophils Relative: 1 %
HCT: 38.7 % (ref 36.0–46.0)
HEMOGLOBIN: 12.9 g/dL (ref 12.0–15.0)
LYMPHS ABS: 1 10*3/uL (ref 0.7–4.0)
LYMPHS PCT: 15 %
MCH: 31.2 pg (ref 26.0–34.0)
MCHC: 33.3 g/dL (ref 30.0–36.0)
MCV: 93.5 fL (ref 78.0–100.0)
MONOS PCT: 6 %
Monocytes Absolute: 0.4 10*3/uL (ref 0.1–1.0)
NEUTROS PCT: 78 %
Neutro Abs: 4.9 10*3/uL (ref 1.7–7.7)
Platelets: 216 10*3/uL (ref 150–400)
RBC: 4.14 MIL/uL (ref 3.87–5.11)
RDW: 12.1 % (ref 11.5–15.5)
WBC: 6.3 10*3/uL (ref 4.0–10.5)

## 2016-02-05 LAB — BASIC METABOLIC PANEL
Anion gap: 6 (ref 5–15)
BUN: 8 mg/dL (ref 6–20)
CHLORIDE: 105 mmol/L (ref 101–111)
CO2: 26 mmol/L (ref 22–32)
CREATININE: 0.64 mg/dL (ref 0.44–1.00)
Calcium: 9.6 mg/dL (ref 8.9–10.3)
GFR calc non Af Amer: >60 mL/min (ref >60)
Glucose, Bld: 112 mg/dL — ABNORMAL HIGH (ref 65–99)
POTASSIUM: 3.9 mmol/L (ref 3.5–5.1)
SODIUM: 137 mmol/L (ref 135–145)

## 2016-02-05 MED ORDER — DIPHENHYDRAMINE HCL 50 MG/ML IJ SOLN
25.0000 mg | Freq: Once | INTRAMUSCULAR | Status: AC
  Administered 2016-02-05: 25 mg via INTRAVENOUS
  Filled 2016-02-05: qty 1

## 2016-02-05 MED ORDER — METOCLOPRAMIDE HCL 5 MG/ML IJ SOLN
10.0000 mg | Freq: Once | INTRAMUSCULAR | Status: AC
  Administered 2016-02-05: 10 mg via INTRAVENOUS
  Filled 2016-02-05: qty 2

## 2016-02-05 MED ORDER — KETOROLAC TROMETHAMINE 30 MG/ML IJ SOLN
30.0000 mg | Freq: Once | INTRAMUSCULAR | Status: DC
  Filled 2016-02-05: qty 1

## 2016-02-05 MED ORDER — KETOROLAC TROMETHAMINE 30 MG/ML IJ SOLN
30.0000 mg | Freq: Once | INTRAMUSCULAR | Status: AC
  Administered 2016-02-05: 30 mg via INTRAVENOUS

## 2016-02-05 NOTE — Discharge Instructions (Signed)
Please read and follow all provided instructions.  Your diagnoses today include:  1. Nonintractable headache, unspecified chronicity pattern, unspecified headache type     Tests performed today include: CT of your head which was normal and did not show any serious cause of your headache Vital signs. See below for your results today.   Medications:  In the Emergency Department you received: Reglan - antinausea/headache medication Benadryl - antihistamine to counteract potential side effects of reglan Toradol - NSAID medication similar to ibuprofen  Take any prescribed medications only as directed.  Additional information:  Follow any educational materials contained in this packet.  You are having a headache. No specific cause was found today for your headache. It may have been a migraine or other cause of headache. Stress, anxiety, fatigue, and depression are common triggers for headaches.   Your headache today does not appear to be life-threatening or require hospitalization, but often the exact cause of headaches is not determined in the emergency department. Therefore, follow-up with your doctor is very important to find out what may have caused your headache and whether or not you need any further diagnostic testing or treatment.   Sometimes headaches can appear benign (not harmful), but then more serious symptoms can develop which should prompt an immediate re-evaluation by your doctor or the emergency department.  BE VERY CAREFUL not to take multiple medicines containing Tylenol (also called acetaminophen). Doing so can lead to an overdose which can damage your liver and cause liver failure and possibly death.   Follow-up instructions: Please follow-up with your primary care provider in the next 3 days for further evaluation of your symptoms.   Return instructions:  Please return to the Emergency Department if you experience worsening symptoms. Return if the medications do not  resolve your headache, if it recurs, or if you have multiple episodes of vomiting or cannot keep down fluids. Return if you have a change from the usual headache. RETURN IMMEDIATELY IF you: Develop a sudden, severe headache Develop confusion or become poorly responsive or faint Have a change in speech, vision, swallowing, or understanding Develop new weakness, numbness, tingling, incoordination in your arms or legs Have a seizure Please return if you have any other emergent concerns.  Additional Information:  Your vital signs today were: BP 126/99 (BP Location: Right Arm)    Pulse 86    Temp 99 F (37.2 C) (Oral)    Resp 18    Ht 5\' 10"  (1.778 m)    Wt 77.1 kg    LMP 01/16/2016 Comment: preg test neg   SpO2 100%    BMI 24.39 kg/m  If your blood pressure (BP) was elevated above 135/85 this visit, please have this repeated by your doctor within one month. --------------

## 2016-02-05 NOTE — ED Notes (Signed)
Patient is resting comfortably. 

## 2016-02-05 NOTE — ED Provider Notes (Signed)
[facility] Provider Note   CSN: [id number] Arrival date & time: 02/05/16  1044     History   Chief Complaint No chief complaint on file.   HPI Sharon Trujillo is a 40 y.o. female.  HPI  40 y.o. female  presents to the Emergency Department today complaining of headache x 9 days. Notes headache is circumferential and rates 4/10. No relief with ibuprofen/motrin. Adequate fluid intake. Notes associated fevers for the past 4 days with TMax 100.87F. No nuchal rigidity. No pain on ROM of neck. Notes URI symptoms of sore throat and ear pain x 3 days ago, but has since resolved. Pt concerned with meningitis vs Bartonella infection (pt is a International aid/development workerveterinarian). Seen on 01-13-16 for Flank pain and UTI symptoms with headache. [facility], Meningitis, Nephrolithiasis. Treated for UTI with follow up to PCP. No CP/SOB/ABD pain. No numbness/tingling. No visual changes. No N/V/D. No other symptoms noted.   Past Medical History:  Diagnosis Date  . Dermatitis herpetiformis 2015/16   Celiac blood tests negative; skin bx neg for dermatitis herpetiformis dx  . GERD (gastroesophageal reflux disease)    pregnancy only  . Gestational hypertension without significant proteinuria in third trimester 11/09/2010  . HSV (herpes simplex virus) anogenital infection   . Lactose intolerance   . Palpitations    stress related palpitations  . Psoriasis    occ left knee pain WITHOUT swelling--resolves with use of CLARITIN (??.)  No hx suspicious for psoriatic arthritis.  . Vitamin D deficiency     Patient Active Problem List   Diagnosis Date Noted  . Influenza due to identified novel influenza A virus with other respiratory manifestations 06/13/2015  . Chronic cough 05/02/2015  . LLL pneumonia (HCC) 03/31/2015  . Tick bite of scalp 03/29/2015  . CAP (community acquired pneumonia) 01/15/2014  . Respiratory infection 01/06/2014  . Wheezing 12/25/2013    Past  Surgical History:  Procedure Laterality Date  . CESAREAN SECTION  11/09/2010   Procedure: CESAREAN SECTION;  Surgeon: Lenoard Adenichard J Taavon, MD;  Location: [facility];  Service: Gynecology;  Laterality: N/A;  Primary Cesarean Section with birth of baby boy @ 2316  . CESAREAN SECTION N/A 01/23/2013   Procedure: CESAREAN SECTION;  Surgeon: Lenoard Adenichard J Taavon, MD;  Location: [facility];  Service: Obstetrics;  Laterality: N/A;  . COLONOSCOPY  04/2008   done for hematochezia (normal).  Needs screening TCS age 40  . DILATION AND EVACUATION  01/16/2012   Procedure: DILATATION AND EVACUATION;  Surgeon: Lenoard Adenichard J Taavon, MD;  Location: [facility];  Service: Gynecology;  Laterality: N/A;  . FEMORAL HERNIA REPAIR  2009   left  . LEEP  2005    OB History    Gravida Para Term Preterm AB Living   3 2 2   1 1    SAB TAB Ectopic Multiple Live Births   1       1       Home Medications    Prior to Admission medications   Medication Sig Start Date End Date Taking? Authorizing Provider  cefdinir (OMNICEF) 300 MG capsule Take 1 capsule (300 mg total) by mouth 2 (two) times daily. 06/16/15   Nyoka CowdenMichael B Wert, MD  clobetasol (OLUX) 0.05 % topical foam Apply 1 application topically daily as needed. 01/31/15   Historical Provider, MD  fexofenadine (ALLEGRA) 180 MG tablet Take 180 mg by mouth daily.    Historical Provider, MD  oseltamivir (TAMIFLU) 75 MG capsule  Take 1 capsule (75 mg total) by mouth 2 (two) times daily. 06/09/15   Nyoka Cowden, MD  Probiotic Product (PROBIOTIC DAILY PO) Take by mouth.    Historical Provider, MD  triamcinolone cream (KENALOG) 0.1 % Apply 1 application topically as needed.     Historical Provider, MD  valACYclovir (VALTREX) 500 MG tablet Take 500 mg by mouth 2 (two) times daily as needed.     Historical Provider, MD    Family History Family History  Problem Relation Age of Onset  . Cancer Maternal Grandmother   . Stroke Maternal Grandmother   . Cancer Paternal Grandfather   . Arthritis Mother    . Hyperlipidemia Mother   . Hypertension Maternal Uncle   . Mental retardation Paternal Aunt   . Heart disease Paternal Grandmother     Social History Social History  Substance Use Topics  . Smoking status: Never Smoker  . Smokeless tobacco: Never Used  . Alcohol use No     Allergies   Albuterol; Codeine; Penicillins; Sulfa antibiotics; and Trimethoprim   Review of Systems Review of Systems ROS reviewed and all are negative for acute change except as noted in the HPI.  Physical Exam Updated Vital Signs BP 126/99 (BP Location: Right Arm)   Pulse 86   Temp 99 F (37.2 C) (Oral)   Resp 18   Ht 5\' 10"  (1.778 m)   Wt 77.1 kg   LMP 12/30/2015 Comment: preg test neg  SpO2 100%   BMI 24.39 kg/m   Physical Exam  Constitutional: She is oriented to person, place, and time. Vital signs are normal. She appears well-developed and well-nourished.  HENT:  Head: Normocephalic.  Right Ear: Hearing normal.  Left Ear: Hearing normal.  Eyes: Conjunctivae and EOM are normal. Pupils are equal, round, and reactive to light.  Neck: Normal range of motion. Neck supple.  Cardiovascular: Normal rate, regular rhythm, normal heart sounds and intact distal pulses.   Pulmonary/Chest: Effort normal and breath sounds normal.  Abdominal: Soft.  Musculoskeletal: Normal range of motion.  Neurological: She is alert and oriented to person, place, and time. She has normal strength. No cranial nerve deficit or sensory deficit.  Cranial Nerves:  II: Pupils equal, round, reactive to light III,IV, VI: ptosis not present, extra-ocular motions intact bilaterally  V,VII: smile symmetric, facial light touch sensation equal VIII: hearing grossly normal bilaterally  IX,X: midline uvula rise  XI: bilateral shoulder shrug equal and strong XII: midline tongue extension Finger to nose exam unremarkable No gait abnormalities  Skin: Skin is warm and dry.  Psychiatric: She has a normal mood and affect. Her  speech is normal and behavior is normal. Thought content normal.  Nursing note and vitals reviewed.  ED Treatments / Results  Labs (all labs ordered are listed, but only abnormal results are displayed) Labs Reviewed - No data to display  EKG  EKG Interpretation None       Radiology No results found.  Procedures Procedures (including critical care time)  Medications Ordered in ED Medications - No data to display   Initial Impression / Assessment and Plan / ED Course  I have reviewed the triage vital signs and the nursing notes.  Pertinent labs & imaging results that were available during my care of the patient were reviewed by me and considered in my medical decision making (see chart for details).  Clinical Course    Final Clinical Impressions(s) / ED Diagnoses  {I have reviewed and evaluated the relevant  laboratory values.   {I have reviewed the relevant previous healthcare records.  {I obtained HPI from historian. {Patient discussed with supervising physician.  ED Course:  Assessment: Patient is a 40yF that presents with headache x 9 days. Fever x 4 days Tmax100.51F. No nuchal rigidity. No N/V. No visual changes. Minimal relief with OTC medications. 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 Patient is without high-risk features of headache including: Sudden onset/thunderclap HA, No similar headache in past, Altered mental status, Accompanying seizure, Headache with exertion, Age > 50, History of immunocompromise, Neck or shoulder pain, Fever, Use of anticoagulation, Family history of spontaneous SAH, Concomitant drug use, Toxic exposure. Patient has a normal complete neurological exam, normal vital signs, normal level of consciousness, no signs of meningismus, is well-appearing/non-toxic appearing, no signs of trauma. No papilledema, no pain over the temporal arteries. Imaging with CT/MRI not indicated given history and physical exam findings.  No dangerous or life-threatening conditions suspected or identified by history, physical exam, and by work-up. No indications for hospitalization identified. Given migraine cocktail in ED with improvement. CBC/BMP unremarkable. Plan is to DC home with follow up to PCP   Disposition/Plan:  DC Home Additional Verbal discharge instructions given and discussed with patient.  Pt Instructed to f/u with PCP in the next week for evaluation and treatment of symptoms. Return precautions given Pt acknowledges and agrees with plan  Supervising Physician Jerelyn ScottMartha Linker, MD  Final diagnoses:  Nonintractable headache, unspecified chronicity pattern, unspecified headache type    New Prescriptions New Prescriptions   No medications on file     Audry Piliyler Alaina Donati, PA-C 02/05/16 1247    Jerelyn ScottMartha Linker, MD 02/05/16 [phone]

## 2016-02-05 NOTE — ED Triage Notes (Signed)
H/A for 9 days, taken OTC meds without relief and fever x 4 days, nausea.

## 2016-02-09 ENCOUNTER — Ambulatory Visit (INDEPENDENT_AMBULATORY_CARE_PROVIDER_SITE_OTHER): Payer: 59 | Admitting: Family Medicine

## 2016-02-09 ENCOUNTER — Encounter: Payer: Self-pay | Admitting: Family Medicine

## 2016-02-09 VITALS — BP 122/85 | HR 77 | Temp 99.2°F | Resp 20 | Ht 70.0 in | Wt 173.8 lb

## 2016-02-09 DIAGNOSIS — R519 Headache, unspecified: Secondary | ICD-10-CM | POA: Insufficient documentation

## 2016-02-09 DIAGNOSIS — R51 Headache: Secondary | ICD-10-CM

## 2016-02-09 DIAGNOSIS — R509 Fever, unspecified: Secondary | ICD-10-CM | POA: Diagnosis not present

## 2016-02-09 LAB — MONONUCLEOSIS SCREEN: Mono Screen: NEGATIVE

## 2016-02-09 MED ORDER — BACLOFEN 20 MG PO TABS: 20.0000 mg | ORAL_TABLET | Freq: Two times a day (BID) | ORAL | 0 refills | Status: DC

## 2016-02-09 NOTE — Patient Instructions (Signed)
I will test for  Mono. I suspect this a viral syndrome that created a neuralgia.  After labs results, we will call you and discuss plan.   Give yourself another week or so to get over this, if fever spikes, night sweats etc then please call in to be seen sooner. If not resolved in 2 weeks, would want to see you.   I have also called in baclofen for you, try it at night first, but usually does not make people tired.

## 2016-02-09 NOTE — Progress Notes (Signed)
Sharon Trujillo , [birth year], 40 y.o., female MRN: [id number] Patient Care Team    Relationship Specialty Notifications Start End  Jeoffrey MassedPhilip H McGowen, MD PCP - General Family Medicine  10/28/13   Olivia Mackieichard Taavon, MD Consulting Physician Obstetrics and Gynecology  11/01/13   Donzetta Starchrew Jones, MD Consulting Physician Dermatology  05/06/14   Nyoka CowdenMichael B Wert, MD Consulting Physician Pulmonary Disease  06/09/15   Serena ColonelJefry Rosen, MD Consulting Physician Otolaryngology  07/20/15     CC: Headache Subjective: Patient presents for an acute office visit complains of a headache of 2 weeks' duration. Patient states that she also has had a low-grade fever over this time, MAXIMUM TEMPERATURE 100.8. He was seen in the emergency room 2 days ago for this condition. She reports the pain was constant and wrapped around the side of her head. She states it even hurt to brush her hair. She also admits to having some neck pain at that time as well. She was given migraine cocktail, and her symptoms were relieved. She has been seeing a chiropractor with adjustments, and it seems to have eased the majority of the neck pain. She continues to have intermittent low-grade fever. She has been taking ibuprofen for the headache. In detail patient states all of her symptoms began and on 01/27/2016 with a sore throat and a sharp pain in her right ear, 2 days later she noticed her neck was sore and tight. She has been seeing the chiropractor since November 21. Currently her symptoms have resolved with the exception of intermittent low-grade fever and mildly decreased appetite. 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   Allergies  Allergen Reactions  . Albuterol Shortness Of Breath    ?rebound bronchospasm when used q3-4 hours?  . Codeine Itching and Nausea And Vomiting    Can tolerate Tramadol  . Penicillins Hives and Swelling  . Sulfa Antibiotics Swelling  . Trimethoprim Swelling    Social History  Substance Use Topics  . Smoking status: Never Smoker  . Smokeless tobacco: Never Used  . Alcohol use No   Past Medical History:  Diagnosis Date  . Dermatitis herpetiformis 2015/16   Celiac blood tests negative; skin bx neg for dermatitis herpetiformis dx  . GERD (gastroesophageal reflux disease)    pregnancy only  . Gestational hypertension without significant proteinuria in third trimester 11/09/2010  . HSV (herpes simplex virus) anogenital infection   . Lactose intolerance   . Palpitations    stress related palpitations  . Psoriasis    occ left knee pain WITHOUT swelling--resolves with use of CLARITIN (??.)  No hx suspicious for psoriatic arthritis.  . Vitamin D deficiency    Past Surgical History:  Procedure Laterality Date  . CESAREAN SECTION  11/09/2010   Procedure: CESAREAN SECTION;  Surgeon: Lenoard Adenichard J Taavon, MD;  Location: [facility];  Service: Gynecology;  Laterality: N/A;  Primary Cesarean Section with birth of baby boy @ 2316  . CESAREAN SECTION N/A 01/23/2013   Procedure: CESAREAN SECTION;  Surgeon: Lenoard Adenichard J Taavon, MD;  Location: [facility];  Service: Obstetrics;  Laterality: N/A;  . COLONOSCOPY  04/2008   done for hematochezia (normal).  Needs screening TCS age 40  . DILATION AND EVACUATION  01/16/2012   Procedure: DILATATION AND EVACUATION;  Surgeon: Lenoard Adenichard J Taavon, MD;  Location: [facility];  Service: Gynecology;  Laterality: N/A;  . FEMORAL HERNIA REPAIR  2009   left  . LEEP  2005  Family History  Problem Relation Age of Onset  . Cancer Maternal Grandmother   . Stroke Maternal Grandmother   . Cancer Paternal Grandfather   . Arthritis Mother   . Hyperlipidemia Mother   . Hypertension Maternal Uncle   . Mental retardation Paternal Aunt   . Heart disease Paternal Grandmother      Medication List       Accurate as of 02/09/16  8:17 AM. Always use your most recent med list.          clobetasol 0.05 % topical foam Commonly known as:   OLUX Apply 1 application topically daily as needed.   fexofenadine 180 MG tablet Commonly known as:  ALLEGRA Take 180 mg by mouth daily.   ILEVRO 0.3 % ophthalmic suspension Generic drug:  nepafenac   multivitamin tablet Take 1 tablet by mouth daily.   omeprazole 10 MG capsule Commonly known as:  PRILOSEC Take 10 mg by mouth daily.   PROBIOTIC DAILY PO Take by mouth.   triamcinolone cream 0.1 % Commonly known as:  KENALOG Apply 1 application topically as needed.   VALTREX 500 MG tablet Generic drug:  valACYclovir Take 500 mg by mouth 2 (two) times daily as needed.       No results found for this or any previous visit (from the past 24 hour(s)). No results found.   ROS: Negative, with the exception of above mentioned in HPI   Objective:  BP 122/85 (BP Location: Right Arm, Patient Position: Sitting, Cuff Size: Large)   Pulse 77   Temp 99.2 F (37.3 C)   Resp 20   Ht 5\' 10"  (1.778 m)   Wt 173 lb 12.8 oz (78.8 kg)   LMP 01/16/2016 Comment: preg test neg  SpO2 99%   BMI 24.94 kg/m  Body mass index is 24.94 kg/m. Gen: febrile. No acute distress. Nontoxic in appearance, well developed, well nourished.  HENT: AT. [place]. Bilateral TM visualized and within normal limits. MMM, no oral lesions. Bilateral nares without erythema or swelling. Throat without erythema or exudates. No cough on exam, no hoarseness on exam. No tenderness to palpation sinus cavity Eyes:Pupils Equal Round Reactive to light, Extraocular movements intact,  Conjunctiva without redness, discharge or icterus. Neck/lymp/endocrine: Supple, no lymphadenopathy CV: RRR Chest: CTAB, no wheeze or crackles. Good air movement, normal resp effort.  MSK: No erythema, no soft tissue swelling, no cervical spine tenderness. No scalp tenderness. Full range of motion cervical spine. Negative Spurling's maneuver. Neurovascular intact distally. Skin: No rashes, purpura or petechiae.  Neuro: Normal gait. PERLA. EOMi.  Alert. Oriented x3   Assessment/Plan: Sharon Trujillo is a 40 y.o. female present for acute OV for  Intermittent fever - Unknown etiology, given the origination from symptoms could be mono infection. Will obtain Monospot today. 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 Monospot returned negative, and her fever continues, will order Bartonella titers for her to have completed. - Monospot  Nonintractable headache, unspecified chronicity pattern, unspecified headache type Seems to have improved, sounded more tension/stress headache. Exam did have bilateral upper trap muscle tension. Discuss use of heat therapy, massage and baclofen. Patient is agreeable to try baclofen as needed. - Baclofen 20 mg 3 times a day when necessary prescribed    > 25 minutes spent with patient, >50% of time spent face to face    electronically signed by:  Felix Pacinienee Kuneff,  DO  [facility] - OR

## 2016-02-10 ENCOUNTER — Telehealth: Payer: Self-pay | Admitting: Family Medicine

## 2016-02-10 DIAGNOSIS — R509 Fever, unspecified: Secondary | ICD-10-CM

## 2016-02-10 NOTE — Telephone Encounter (Signed)
Left message on voicemail for patient to return call. 

## 2016-02-10 NOTE — Telephone Encounter (Signed)
Please call pt: - her mono test was negative.  - I have ordered a bartonella labs to be collected within the next week by lab appt.

## 2016-02-13 NOTE — Telephone Encounter (Signed)
Please call pt: - I have checked and we are not able to use that particular lab.  - We can use our lab or I can give her a hand written script for her to have collected at a lab of her choice that excepts our orders.

## 2016-02-13 NOTE — Telephone Encounter (Signed)
Spoke with patient reviewed information . Patient states she wants her Bartonella labs sent to [facility] she states they specialize in this lab work. Informed patient this is not a lab we participate with. She states she will order a kit from them to have us send. Informed patient I would need to speak to Dr Kuneff regarding this. Please advise. 

## 2016-02-13 NOTE — Telephone Encounter (Signed)
Left message on patient voice mail with information and instructions to call back and let us know if she needs written script or an appt to have done through our lab.

## 2016-02-13 NOTE — Telephone Encounter (Signed)
I will check on this, I am not certain if this is something we can accommodate her with or not.  Marchelle Folksmanda is this something we are able to do for her, or do when need to have her use our lab? Please advise.

## 2016-02-20 ENCOUNTER — Encounter: Payer: Self-pay | Admitting: Family Medicine

## 2016-02-20 ENCOUNTER — Encounter: Payer: Self-pay | Admitting: *Deleted

## 2016-02-21 ENCOUNTER — Ambulatory Visit (INDEPENDENT_AMBULATORY_CARE_PROVIDER_SITE_OTHER): Payer: 59

## 2016-02-21 DIAGNOSIS — Z23 Encounter for immunization: Secondary | ICD-10-CM | POA: Diagnosis not present

## 2016-02-23 ENCOUNTER — Telehealth: Payer: Self-pay | Admitting: Family Medicine

## 2016-02-23 NOTE — Telephone Encounter (Signed)
LM for patient asking where she was going to have her lab drawn for her Bartonella test. She left a form here at the office for completion but we are not able to draw this test for her and then give her the sample to send off herself. If we do the draw here it will go to [facility].

## 2016-02-28 ENCOUNTER — Encounter: Payer: Self-pay | Admitting: Family Medicine

## 2016-03-23 ENCOUNTER — Telehealth: Payer: Self-pay | Admitting: Family Medicine

## 2016-03-23 NOTE — Telephone Encounter (Signed)
Victorino DikeJennifer with [facility] is calling to request dx code for bartonella test that was ordered on 02/15/16.  Please return call to her at [phone].  They have been experiencing issues with their phones today, if unable to reach via phone, please email info to [email or web address]

## 2016-03-23 NOTE — Telephone Encounter (Signed)
Please advise. Thanks.  

## 2016-03-26 NOTE — Telephone Encounter (Signed)
[facility] with dx codes ICD-10-CM: S00.06XD, [id number].  Patient can use a lab of her choice and all lab orders require a dx code before they can be processed.

## 2016-03-26 NOTE — Telephone Encounter (Signed)
There seems to be confusion surrounding this lab. I ordered a lab through the computer. To my knowledge pt wanted specialized labs, not routinely completed, at a specialized laboratory in which we did not participate. It does not much matter where she obtained the labs, as far as I am concerned, however I am uncertain of insurance coverage for the other labs that I did not place an order for her to have completed.

## 2016-03-27 ENCOUNTER — Other Ambulatory Visit: Payer: Self-pay

## 2016-05-21 IMAGING — DX DG CHEST 2V
2 series · 2 of 2 positions shown · non-contrast
Comparison: 03/29/2015

CLINICAL DATA: Recheck pneumonia

EXAM:
CHEST  2 VIEW

[chest pa]
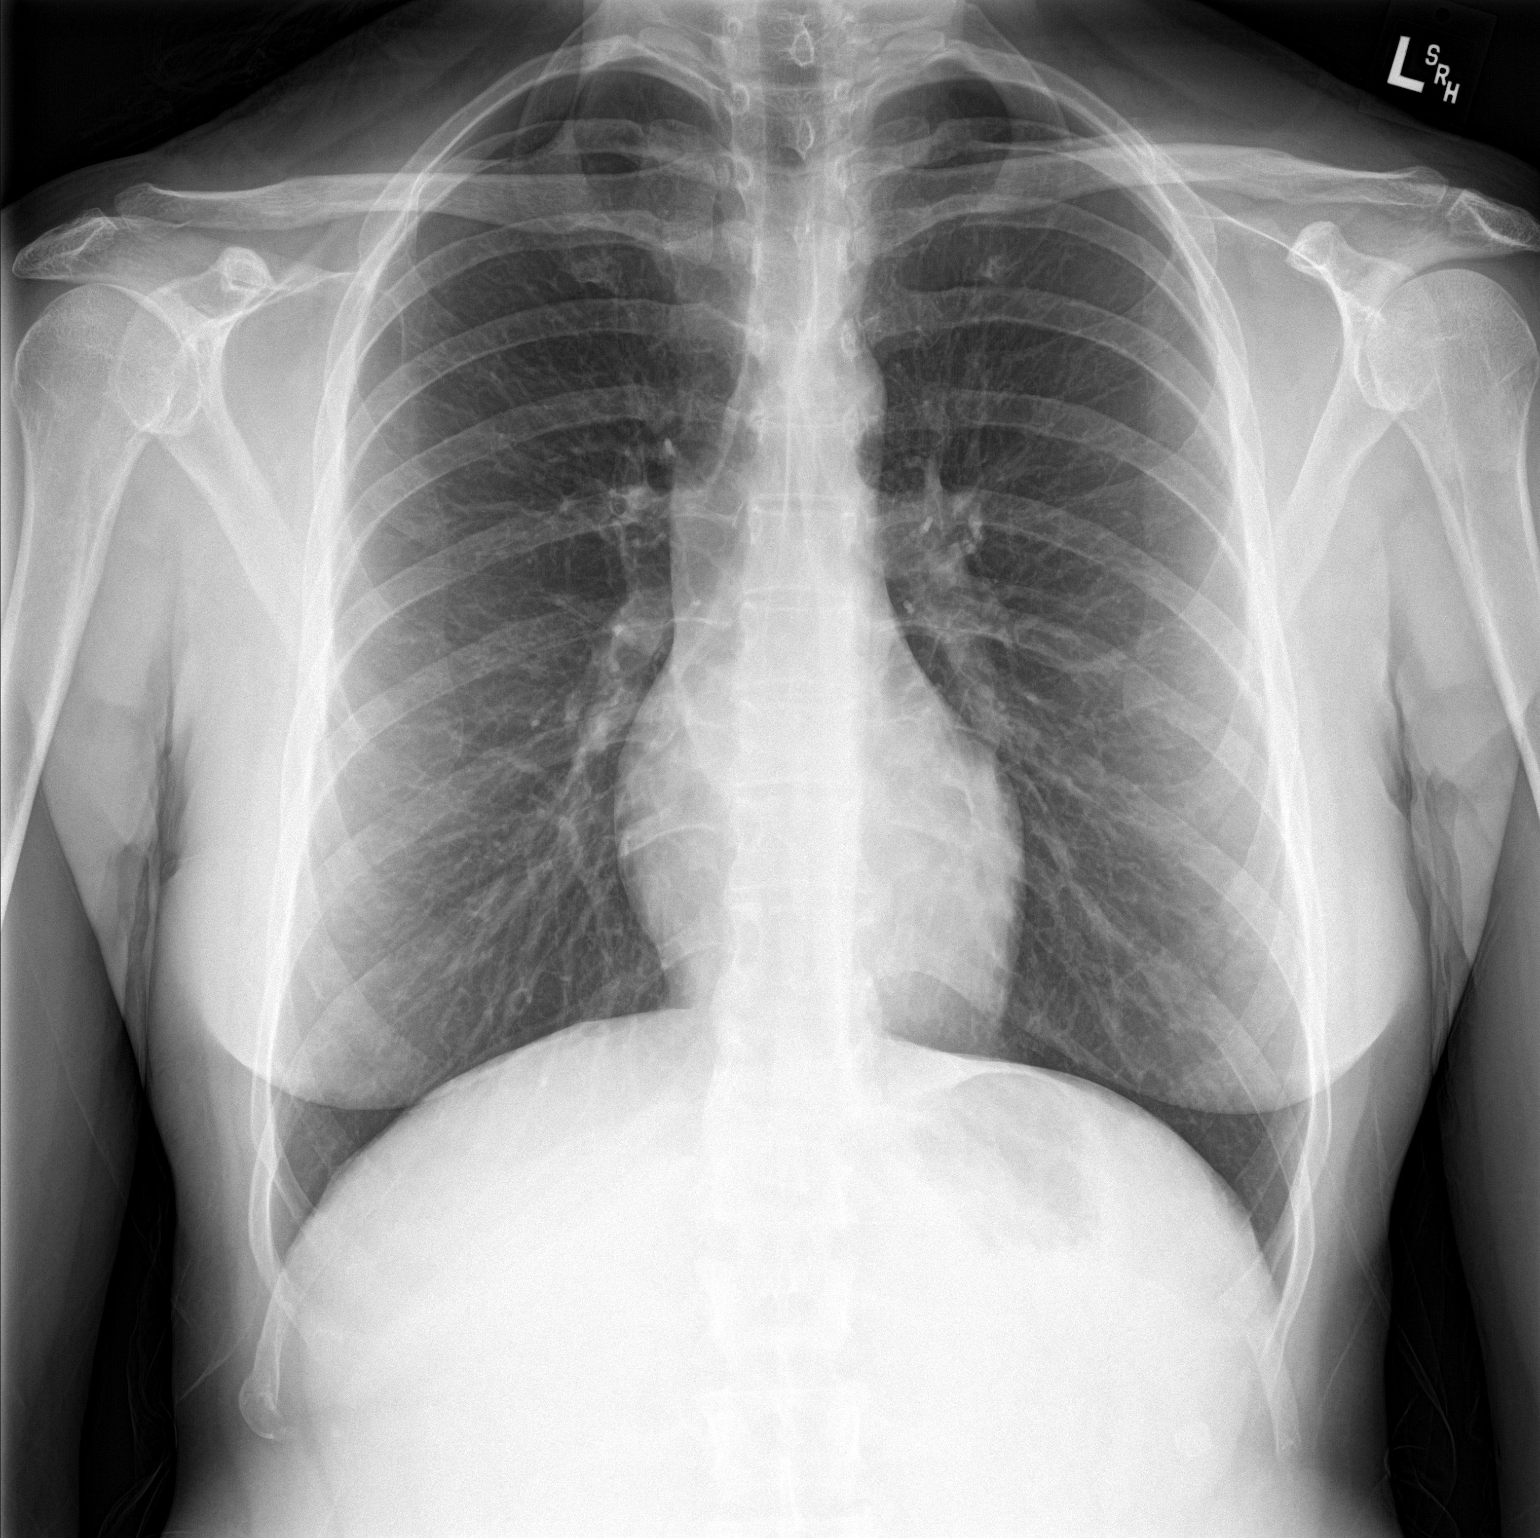

[chest lat]
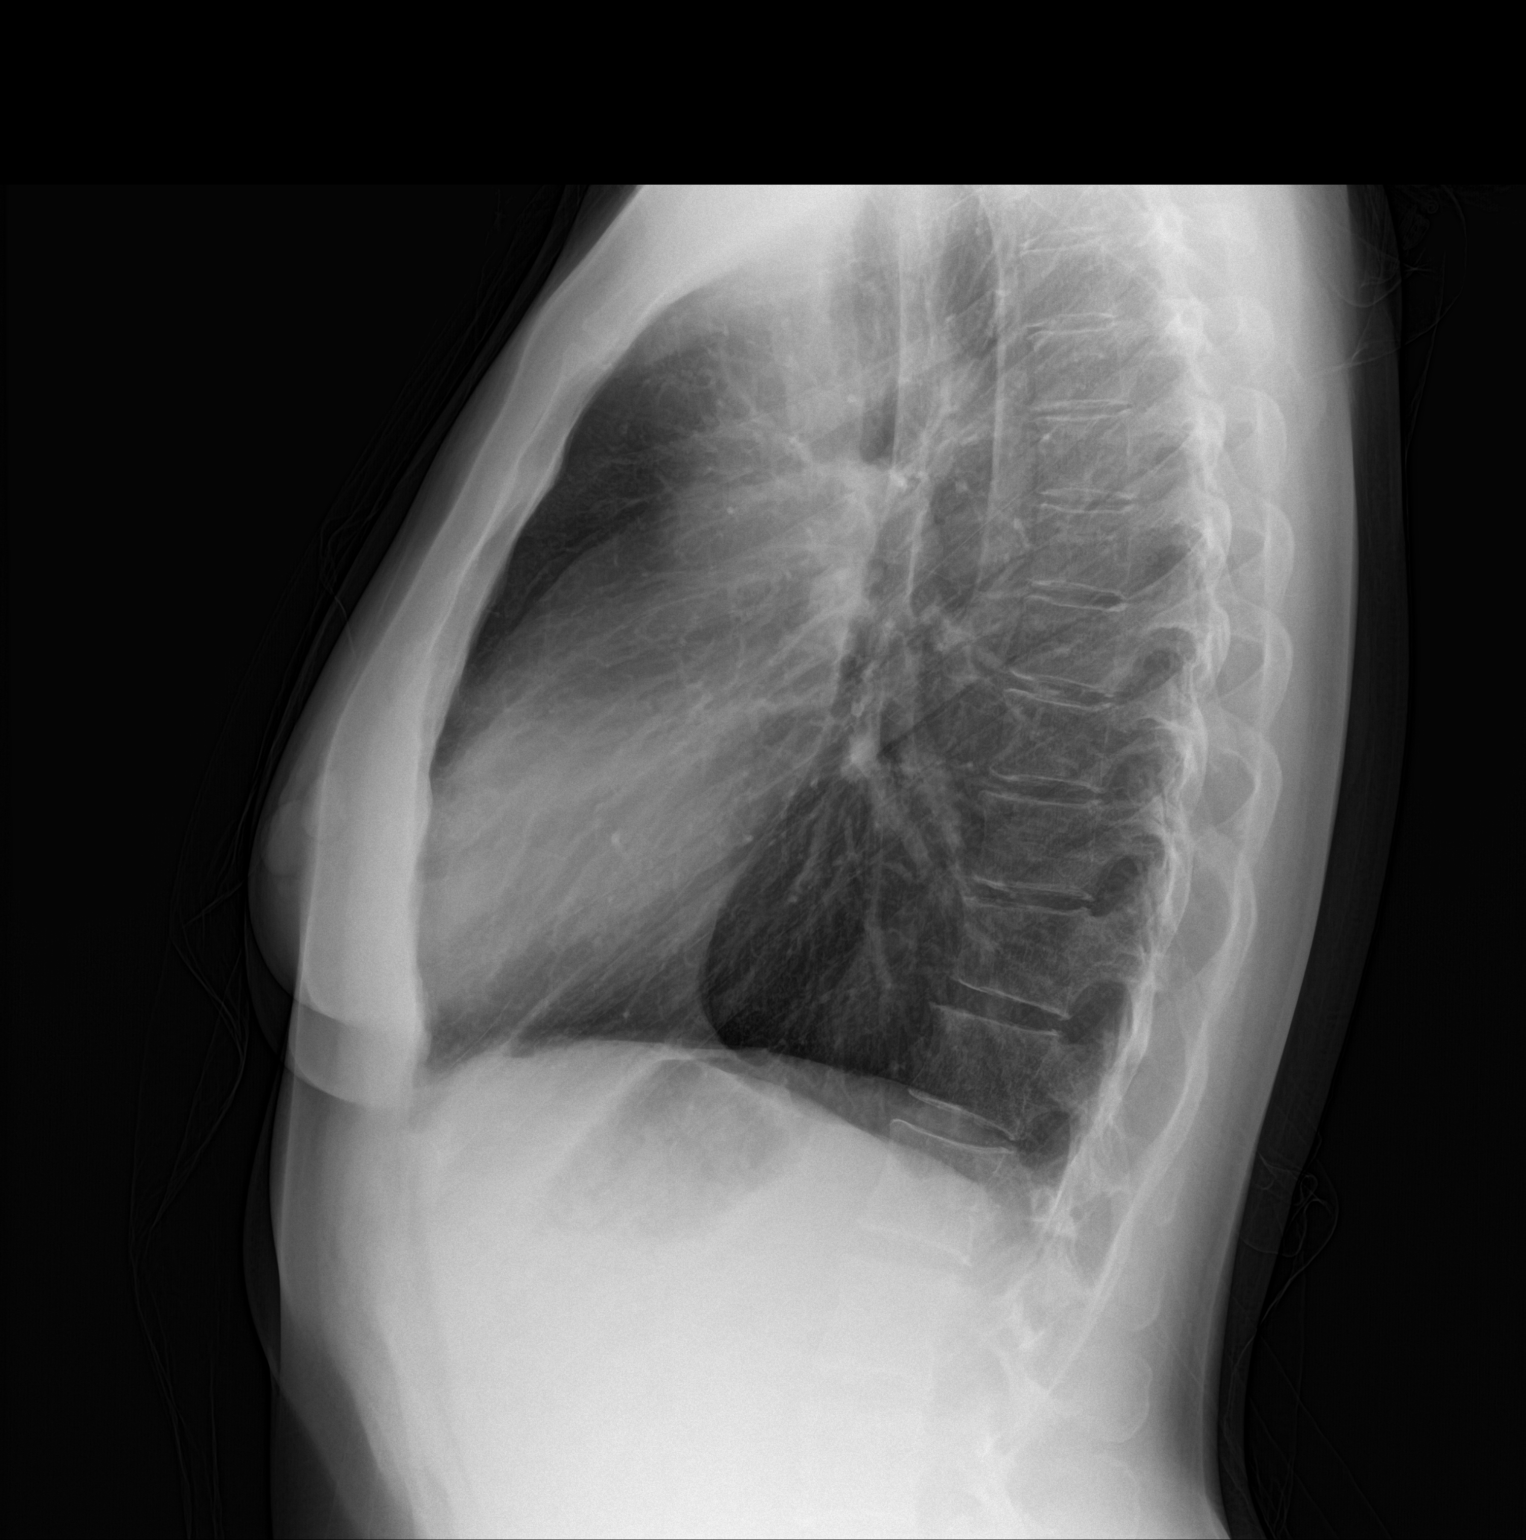

[2 of 2 positions shown; findings below may reference images not displayed]

FINDINGS: Interval clearance of the left lower lobe infiltrate. Lungs are
clear. Heart is normal size. No effusions or acute bony abnormality.
IMPRESSION: Resolution of previously seen left lower lobe pneumonia. No active
disease.

## 2016-06-07 ENCOUNTER — Ambulatory Visit (INDEPENDENT_AMBULATORY_CARE_PROVIDER_SITE_OTHER): Payer: 59 | Admitting: Physician Assistant

## 2016-06-07 ENCOUNTER — Encounter: Payer: Self-pay | Admitting: Physician Assistant

## 2016-06-07 VITALS — BP 120/80 | HR 91 | Temp 99.4°F | Ht 70.0 in | Wt 174.0 lb

## 2016-06-07 DIAGNOSIS — J020 Streptococcal pharyngitis: Secondary | ICD-10-CM

## 2016-06-07 DIAGNOSIS — R509 Fever, unspecified: Secondary | ICD-10-CM

## 2016-06-07 LAB — POCT RAPID STREP A (OFFICE): Rapid Strep A Screen: POSITIVE — AB

## 2016-06-07 MED ORDER — PREDNISONE 20 MG PO TABS: 20.0000 mg | ORAL_TABLET | Freq: Two times a day (BID) | ORAL | Status: DC

## 2016-06-07 MED ORDER — AZITHROMYCIN 500 MG PO TABS: 500.0000 mg | ORAL_TABLET | Freq: Every day | ORAL | 0 refills | Status: DC

## 2016-06-07 NOTE — Patient Instructions (Signed)
Start the antibiotic today. Begin oral steroid as well. You can just take 20 mg today, and start 40 mg tomorrow.  Please let us know if fever >101.5, trouble opening/closing mouth, difficulty swallowing, or worsening instead of improving as expected.  If any airway compromise -- immediately go to the emergency room.   Strep Throat Strep throat is a bacterial infection of the throat. Your health care provider may call the infection tonsillitis or pharyngitis, depending on whether there is swelling in the tonsils or at the back of the throat. Strep throat is most common during the cold months of the year in children who are 185-515 years of age, but it can happen during any season in people of any age. This infection is spread from person to person (contagious) through coughing, sneezing, or close contact. What are the causes? Strep throat is caused by the bacteria called Streptococcus pyogenes. What increases the risk? This condition is more likely to develop in:  People who spend time in crowded places where the infection can spread easily.  People who have close contact with someone who has strep throat. What are the signs or symptoms? Symptoms of this condition include:  Fever or chills.  Redness, swelling, or pain in the tonsils or throat.  Pain or difficulty when swallowing.  White or yellow spots on the tonsils or throat.  Swollen, tender glands in the neck or under the jaw.  Red rash all over the body (rare). How is this diagnosed? This condition is diagnosed by performing a rapid strep test or by taking a swab of your throat (throat culture test). Results from a rapid strep test are usually ready in a few minutes, but throat culture test results are available after one or two days. How is this treated? This condition is treated with antibiotic medicine. Follow these instructions at home: Medicines   Take over-the-counter and prescription medicines only as told by your health  care provider.  Take your antibiotic as told by your health care provider. Do not stop taking the antibiotic even if you start to feel better.  Have family members who also have a sore throat or fever tested for strep throat. They may need antibiotics if they have the strep infection. Eating and drinking   Do not share food, drinking cups, or personal items that could cause the infection to spread to other people.  If swallowing is difficult, try eating soft foods until your sore throat feels better.  Drink enough fluid to keep your urine clear or pale yellow. General instructions   Gargle with a salt-water mixture 3-4 times per day or as needed. To make a salt-water mixture, completely dissolve -1 tsp of salt in 1 cup of warm water.  Make sure that all household members wash their hands well.  Get plenty of rest.  Stay home from school or work until you have been taking antibiotics for 24 hours.  Keep all follow-up visits as told by your health care provider. This is important. Contact a health care provider if:  The glands in your neck continue to get bigger.  You develop a rash, cough, or earache.  You cough up a thick liquid that is green, yellow-brown, or bloody.  You have pain or discomfort that does not get better with medicine.  Your problems seem to be getting worse rather than better.  You have a fever. Get help right away if:  You have new symptoms, such as vomiting, severe headache, stiff or painful  neck, chest pain, or shortness of breath.  You have severe throat pain, drooling, or changes in your voice.  You have swelling of the neck, or the skin on the neck becomes red and tender.  You have signs of dehydration, such as fatigue, dry mouth, and decreased urination.  You become increasingly sleepy, or you cannot wake up completely.  Your joints become red or painful. This information is not intended to replace advice given to you by your health care  provider. Make sure you discuss any questions you have with your health care provider. Document Released: 02/24/2000 Document Revised: 10/26/2015 Document Reviewed: 06/21/2014 Elsevier Interactive Patient Education  2017 [employer].

## 2016-06-07 NOTE — Progress Notes (Signed)
Pre visit review using our clinic review tool, if applicable. No additional management support is needed unless otherwise documented below in the visit note. 

## 2016-06-07 NOTE — Progress Notes (Addendum)
Sharon Trujillo is a 41 y.o. female here for a new problem.   History of Present Illness:   Chief Complaint  Patient presents with  . Sore Throat    started Monday  . Fever    started yesterday 101.2   . Nasal Congestion    mild  . when clear throat is able to bring up green phelm    HPI   Patient reports that she has developed fever 101.2 yesterday, sore throat started Monday, intermittent nasal congestion, and no cough. She states that her daughter had what she thinks was strep recently. She has been taking some Advil with mild relief. She is eating and drinking well. She denies any shortness of breath, chest pain, difficulty breathing.  PMHx, SurgHx, SocialHx, Medications, and Allergies were reviewed in the Visit Navigator and updated as appropriate.  Current Medications:   Current Outpatient Prescriptions:  .  clobetasol (OLUX) 0.05 % topical foam, Apply 1 application topically daily as needed., Disp: , Rfl: 5 .  fexofenadine (ALLEGRA) 180 MG tablet, Take 180 mg by mouth daily., Disp: , Rfl:  .  ILEVRO 0.3 % ophthalmic suspension, Place 1 drop into both eyes as needed. , Disp: , Rfl:  .  Multiple Vitamin (MULTIVITAMIN) tablet, Take 1 tablet by mouth daily., Disp: , Rfl:  .  omeprazole (PRILOSEC OTC) 20 MG tablet, Take 20 mg by mouth daily., Disp: , Rfl:  .  Probiotic Product (PROBIOTIC DAILY PO), Take by mouth., Disp: , Rfl:  .  triamcinolone cream (KENALOG) 0.1 %, Apply 1 application topically as needed. , Disp: , Rfl:  .  valACYclovir (VALTREX) 500 MG tablet, Take 500 mg by mouth 2 (two) times daily as needed. , Disp: , Rfl:  .  azithromycin (ZITHROMAX) 500 MG tablet, Take 1 tablet (500 mg total) by mouth daily., Disp: 5 tablet, Rfl: 0 .  predniSONE (DELTASONE) 20 MG tablet, Take 1 tablet (20 mg total) by mouth 2 (two) times daily with a meal., Disp: 10 tablet, Rfl: 00   Review of Systems:   Review of Systems  Constitutional: Positive for fever.  HENT: Positive for sore  throat.   Eyes: Negative.   Respiratory: Positive for sputum production.   Cardiovascular: Negative.   Gastrointestinal: Negative.   Genitourinary: Negative.   Musculoskeletal: Negative.   Skin: Negative.   Neurological: Negative.   Endo/Heme/Allergies: Positive for environmental allergies.  Psychiatric/Behavioral: Negative.     Vitals:   Vitals:   06/07/16 1053  BP: 120/80  Pulse: 91  Temp: 99.4 F (37.4 C)  TempSrc: Oral  SpO2: 98%  Weight: 174 lb (78.9 kg)  Height: 5\' 10"  (1.778 m)     Body mass index is 24.97 kg/m.  Physical Exam:   Physical Exam  Constitutional: She appears well-developed. She is cooperative.  Non-toxic appearance. She does not have a sickly appearance. She does not appear ill. No distress.  HENT:  Head: Normocephalic and atraumatic.  Right Ear: Tympanic membrane, external ear and ear canal normal. Tympanic membrane is not erythematous, not retracted and not bulging.  Left Ear: Tympanic membrane, external ear and ear canal normal. Tympanic membrane is not erythematous, not retracted and not bulging.  Nose: Nose normal. Right sinus exhibits no maxillary sinus tenderness and no frontal sinus tenderness. Left sinus exhibits no maxillary sinus tenderness and no frontal sinus tenderness.  Mouth/Throat: Uvula is midline and mucous membranes are normal. No trismus in the jaw. No uvula swelling. Oropharyngeal exudate, posterior oropharyngeal edema and posterior  oropharyngeal erythema present.  Tonsils 2+ bilaterally  Eyes: Conjunctivae and lids are normal.  Neck: Trachea normal and phonation normal.  Cardiovascular: Normal rate, regular rhythm, S1 normal, S2 normal and normal heart sounds.   Pulmonary/Chest: Effort normal and breath sounds normal. She has no decreased breath sounds. She has no wheezes. She has no rhonchi. She has no rales.  Lymphadenopathy:    She has cervical adenopathy.  Neurological: She is alert.  Skin: Skin is warm, dry and intact.   Psychiatric: She has a normal mood and affect. Her speech is normal and behavior is normal.  Nursing note and vitals reviewed.   Results for orders placed or performed in visit on 06/07/16  POCT rapid strep A  Result Value Ref Range   Rapid Strep A Screen Positive (A) Negative     Assessment and Plan:    Sharon Trujillo was seen today for sore throat, fever, nasal congestion and when clear throat is able to bring up green phelm.  Diagnoses and all orders for this visit:  Strep throat -     POCT rapid strep A  Fever, unspecified fever cause -     POCT rapid strep A  Other orders -     azithromycin (ZITHROMAX) 500 MG tablet; Take 1 tablet (500 mg total) by mouth daily. -     predniSONE (DELTASONE) 20 MG tablet; Take 1 tablet (20 mg total) by mouth 2 (two) times daily with a meal.   Rapid strep test positive. She is penicillin allergic, I will do azithromycin per orders. Additionally for the swelling we will do prednisone 20 mg twice a day x 5 d. Discussed that if she were to develop any changes in breathing, throat swelling, or any other worrisome signs such as continued fever I would like for her to be reevaluated promptly. Patient is agreeable to plan.  . Reviewed expectations re: course of current medical issues. . Discussed self-management of symptoms. . Outlined signs and symptoms indicating need for more acute intervention. . Patient verbalized understanding and all questions were answered. . See orders for this visit as documented in the electronic medical record. . Patient received an After-Visit Summary.   Jarold MottoSamantha Loretto Belinsky, PA-C

## 2016-09-17 DIAGNOSIS — H04123 Dry eye syndrome of bilateral lacrimal glands: Secondary | ICD-10-CM | POA: Diagnosis not present

## 2016-09-17 DIAGNOSIS — H5213 Myopia, bilateral: Secondary | ICD-10-CM | POA: Diagnosis not present

## 2016-09-17 DIAGNOSIS — L718 Other rosacea: Secondary | ICD-10-CM | POA: Diagnosis not present

## 2017-02-01 IMAGING — CT CT RENAL STONE PROTOCOL
2 of 4 series · 16 of 46 positions shown, 18 images · non-contrast
Comparison: None.

CLINICAL DATA: Left flank pain, hematuria

EXAM:
CT ABDOMEN AND PELVIS WITHOUT CONTRAST
TECHNIQUE: Multidetector CT imaging of the abdomen and pelvis was performed
following the standard protocol without IV contrast.

[Series 2: axial st · axial · 0.98mm/px · z∈[-612,-217]mm · 13 of 87 slices shown, 15 images]
[im 4/87  soft-tissue]
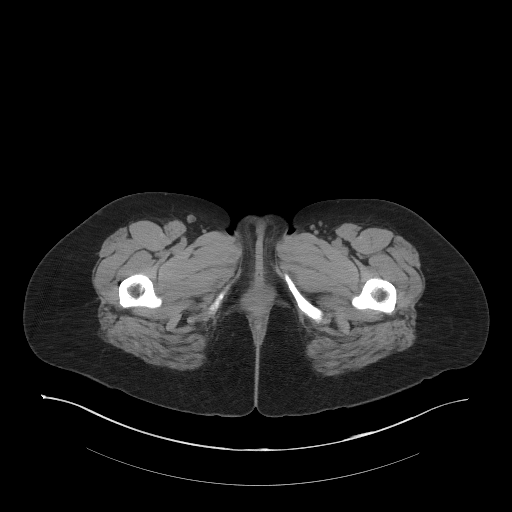
[im 4/87  bone]
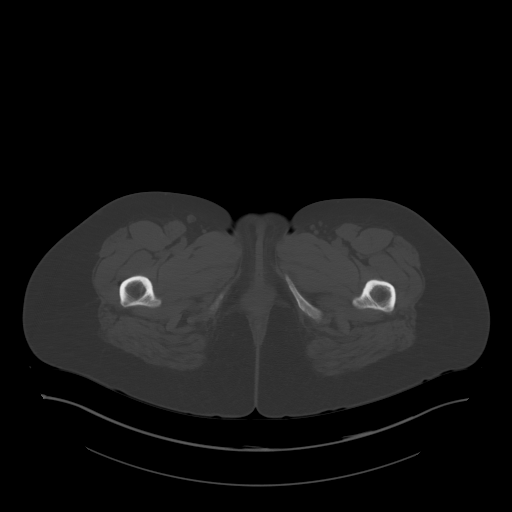
[im 10/87  soft-tissue]
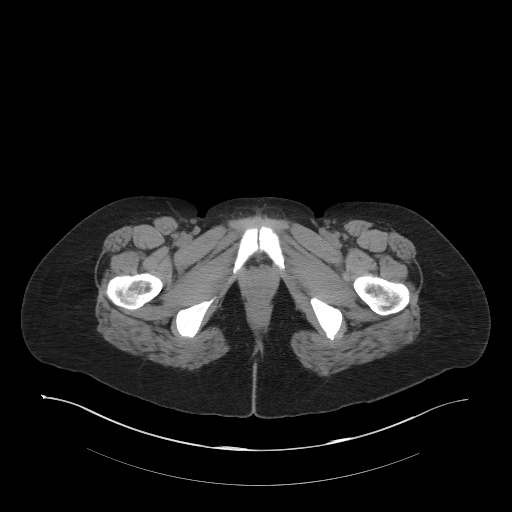
[im 17/87  soft-tissue]
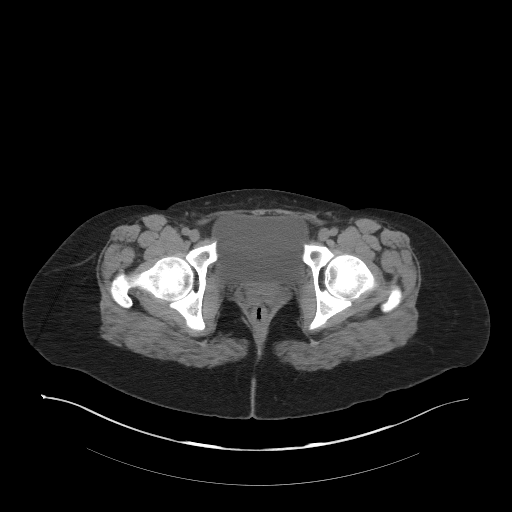
[im 24/87  soft-tissue]
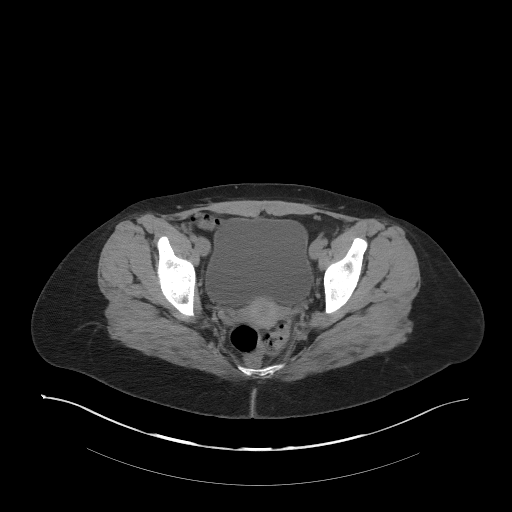
[im 30/87  soft-tissue]
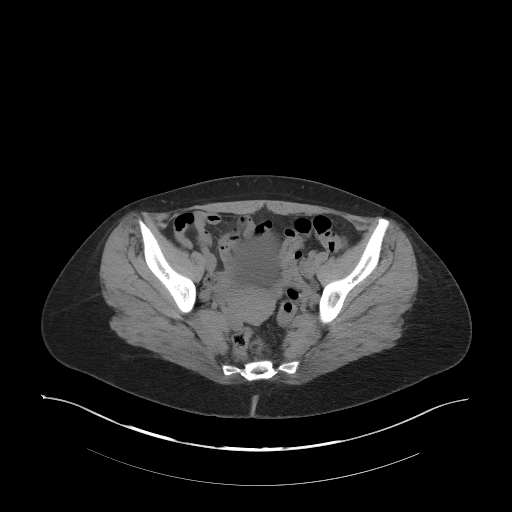
[im 37/87  soft-tissue]
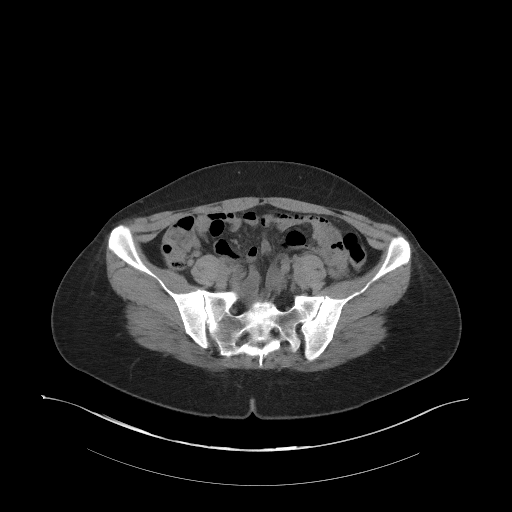
[im 44/87  soft-tissue]
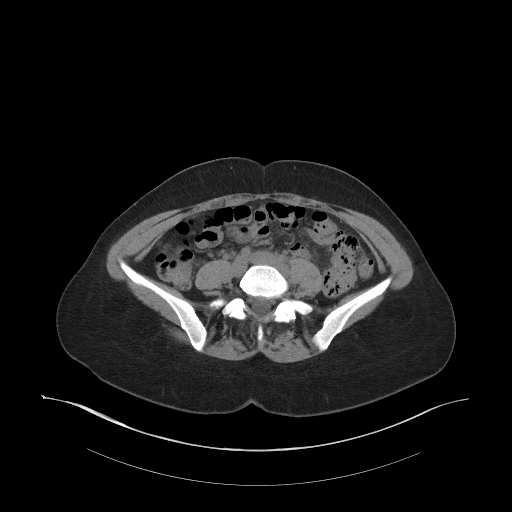
[im 50/87  soft-tissue]
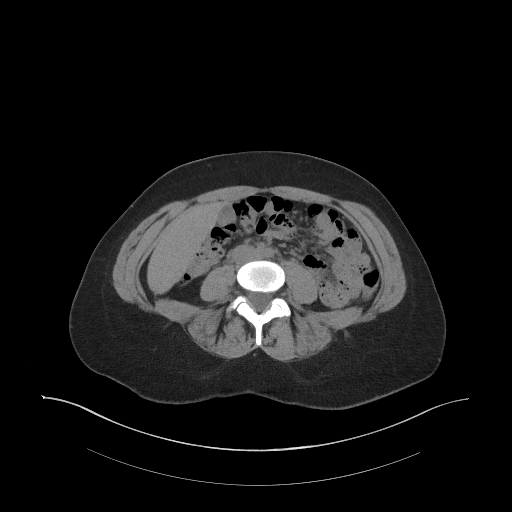
[im 57/87  soft-tissue]
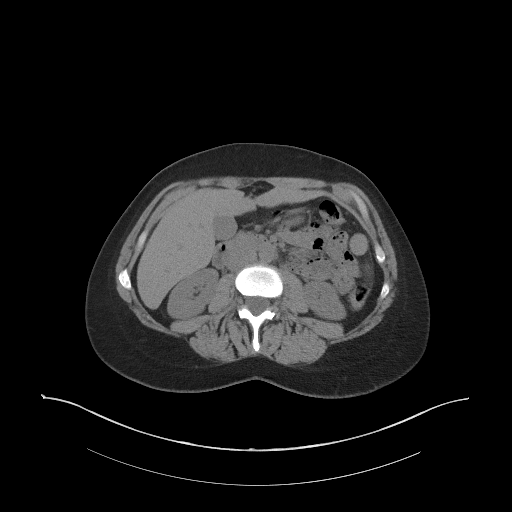
[im 57/87  bone]
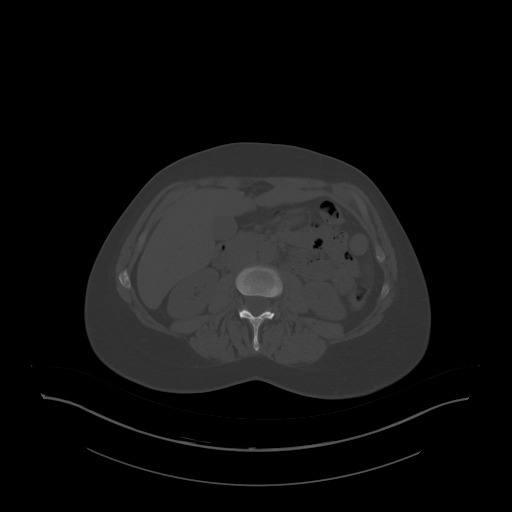
[im 63/87  soft-tissue]
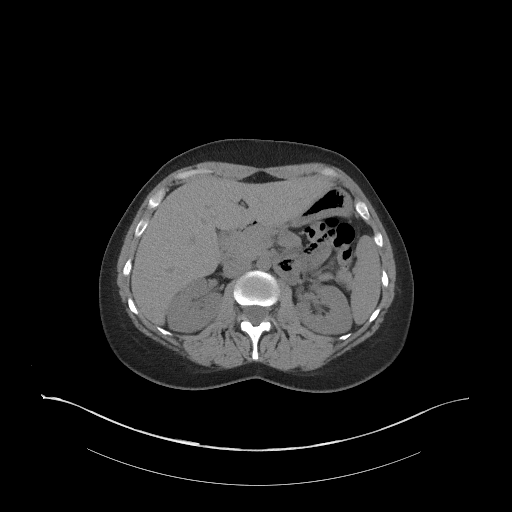
[im 70/87  soft-tissue]
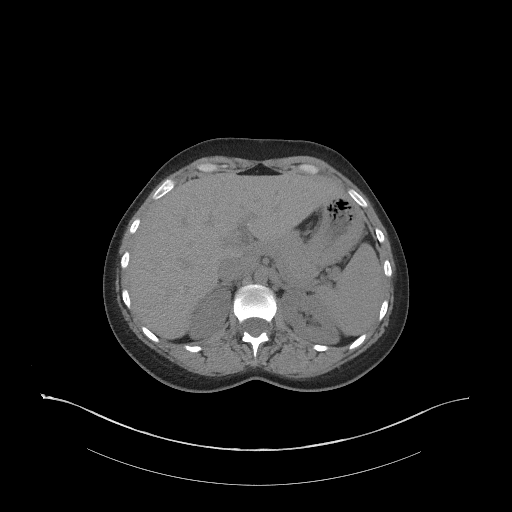
[im 77/87  soft-tissue]
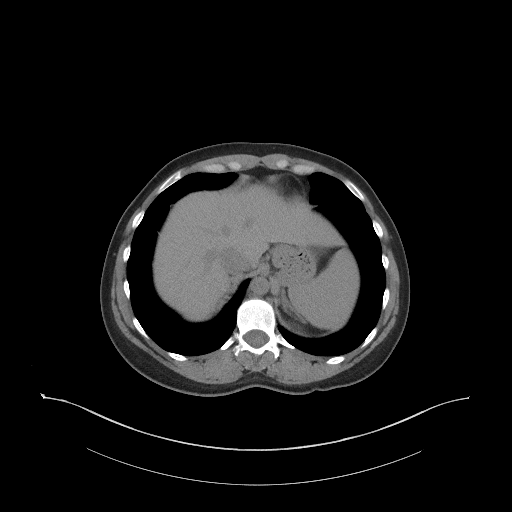
[im 83/87  soft-tissue]
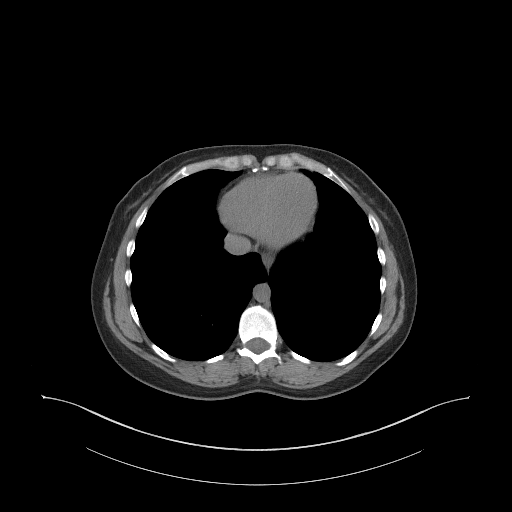

[Series 5: coronal st · coronal · 0.63mm/px · 3 of 95 slices shown]
[im 32/95  soft-tissue]
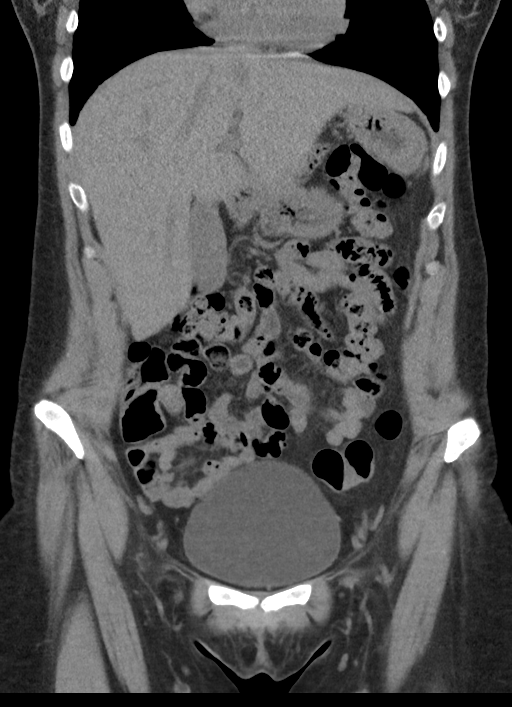
[im 42/95  soft-tissue]
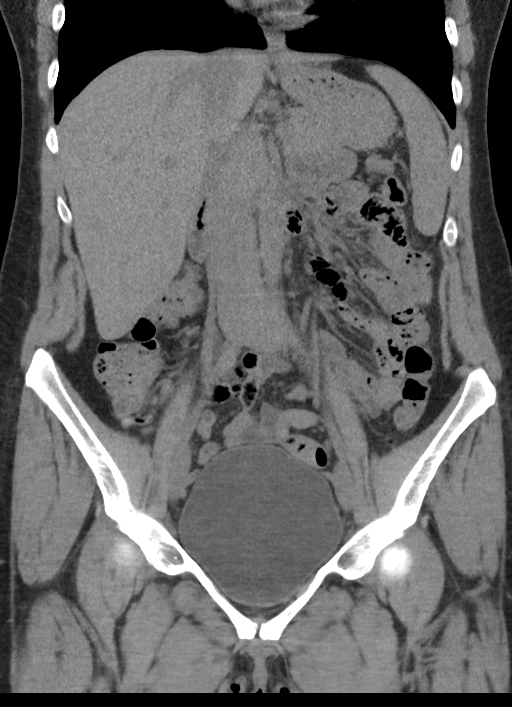
[im 53/95  soft-tissue]
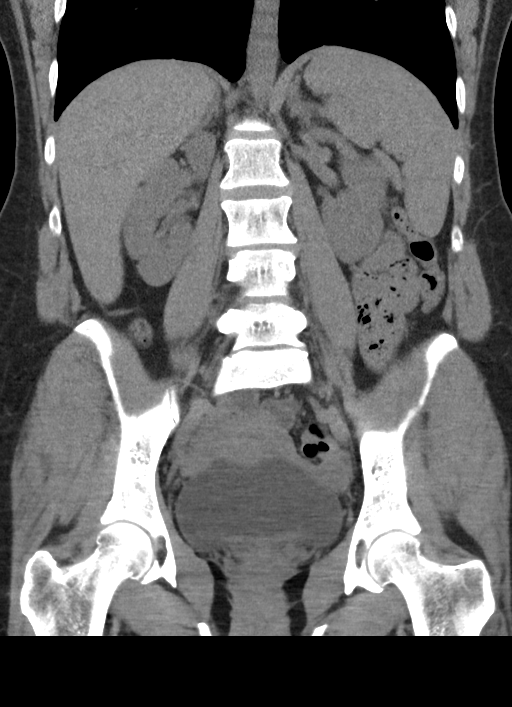

[16 of 46 positions shown; findings below may reference images not displayed]

FINDINGS: Lower chest: The lung bases are unremarkable.

Hepatobiliary: Unenhanced liver shows no biliary ductal dilatation.
No calcified gallstones are noted within gallbladder.

Pancreas: Unenhanced pancreas is unremarkable.

Spleen: Unenhanced spleen is unremarkable.

Adrenals/Urinary Tract: No adrenal gland mass. No nephrolithiasis.
No hydronephrosis or hydroureter. No calcified ureteral calculi.
Bilateral distal ureter is unremarkable. No calcified calculi are
noted within urinary bladder.

Stomach/Bowel: There is no small bowel obstruction. No thickened or
dilated small bowel loops. Normal appendix noted in coronal image
45. No pericecal inflammation. No colonic obstruction.

Vascular/Lymphatic: No aortic aneurysm. No retroperitoneal or
mesenteric adenopathy.

Reproductive: The uterus is anteflexed.  No adnexal masses noted.

Other: No ascites or free abdominal air.  No inguinal adenopathy.

Musculoskeletal: Sagittal images of the spine shows mild
degenerative changes lower thoracic spine. No destructive bony
lesions are noted. Mild anterior spurring upper endplate of L4 and
L5 vertebral body.
IMPRESSION: 1. No nephrolithiasis.  No hydronephrosis or hydroureter.
2. No calcified ureteral calculi. No calcified calculi are noted
within urinary bladder.
3. Normal appendix.  No pericecal inflammation.
4. No small bowel obstruction.

## 2017-03-13 DIAGNOSIS — R300 Dysuria: Secondary | ICD-10-CM | POA: Diagnosis not present

## 2017-03-13 DIAGNOSIS — Z01419 Encounter for gynecological examination (general) (routine) without abnormal findings: Secondary | ICD-10-CM | POA: Diagnosis not present

## 2017-03-13 LAB — HM PAP SMEAR: HM PAP: NORMAL

## 2017-08-12 ENCOUNTER — Encounter: Payer: Self-pay | Admitting: Family Medicine

## 2017-08-12 ENCOUNTER — Other Ambulatory Visit: Payer: Self-pay | Admitting: Family Medicine

## 2017-08-12 ENCOUNTER — Ambulatory Visit (HOSPITAL_BASED_OUTPATIENT_CLINIC_OR_DEPARTMENT_OTHER)
Admission: RE | Admit: 2017-08-12 | Discharge: 2017-08-12 | Disposition: A | Discharge status: Home / Self Care | Payer: 59 | Source: Ambulatory Visit | Attending: Family Medicine | Admitting: Family Medicine

## 2017-08-12 ENCOUNTER — Ambulatory Visit (INDEPENDENT_AMBULATORY_CARE_PROVIDER_SITE_OTHER): Payer: 59 | Admitting: Family Medicine

## 2017-08-12 VITALS — BP 118/88 | HR 99 | Temp 98.5°F | Resp 16 | Ht 70.0 in | Wt 176.0 lb

## 2017-08-12 DIAGNOSIS — J329 Chronic sinusitis, unspecified: Secondary | ICD-10-CM | POA: Diagnosis not present

## 2017-08-12 DIAGNOSIS — Z888 Allergy status to other drugs, medicaments and biological substances status: Secondary | ICD-10-CM

## 2017-08-12 DIAGNOSIS — J301 Allergic rhinitis due to pollen: Secondary | ICD-10-CM

## 2017-08-12 DIAGNOSIS — R220 Localized swelling, mass and lump, head: Secondary | ICD-10-CM

## 2017-08-12 DIAGNOSIS — J320 Chronic maxillary sinusitis: Secondary | ICD-10-CM | POA: Insufficient documentation

## 2017-08-12 MED ORDER — PREDNISONE 20 MG PO TABS: ORAL_TABLET | ORAL | 0 refills | Status: DC

## 2017-08-12 MED ORDER — CLINDAMYCIN HCL 300 MG PO CAPS: ORAL_CAPSULE | ORAL | 0 refills | Status: DC

## 2017-08-12 NOTE — Progress Notes (Signed)
OFFICE VISIT  08/13/2017   CC:  Chief Complaint  Patient presents with  . Sinusitis    tongue swelling   HPI:    Patient is a 42 y.o. Caucasian female who presents for respiratory symptoms. Lots of allergies-->seasonal, worse this year.  Last 2 weeks, nasal congestion, lots of coughing that sounds like bronchospasm.  She started cephalexin 500 at some point recently. About 4 d/a she felt like her tongue felt swollen, then noted it looked swollen the next day.  She then stopped the cephalexin but no change in tough.  No throat swelling, no wheezing or SOB.  No swelling or lips or eyes.   Past Medical History:  Diagnosis Date  . Dermatitis herpetiformis 2015/16   Celiac blood tests negative; skin bx neg for dermatitis herpetiformis dx  . GERD (gastroesophageal reflux disease)    pregnancy only  . Gestational hypertension without significant proteinuria in third trimester 11/09/2010  . HSV (herpes simplex virus) anogenital infection   . Lactose intolerance   . Palpitations    stress related palpitations  . Psoriasis    occ left knee pain WITHOUT swelling--resolves with use of CLARITIN (??.)  No hx suspicious for psoriatic arthritis.  Marland Kitchen. Upper airway cough syndrome 2016/2017   Saw Dr. Sherene SiresWert 2017  . Vitamin D deficiency     Past Surgical History:  Procedure Laterality Date  . CESAREAN SECTION  11/09/2010   Procedure: CESAREAN SECTION;  Surgeon: Lenoard Adenichard J Taavon, MD;  Location: [facility];  Service: Gynecology;  Laterality: N/A;  Primary Cesarean Section with birth of baby boy @ 2316  . CESAREAN SECTION N/A 01/23/2013   Procedure: CESAREAN SECTION;  Surgeon: Lenoard Adenichard J Taavon, MD;  Location: [facility];  Service: Obstetrics;  Laterality: N/A;  . COLONOSCOPY  04/2008   done for hematochezia (normal).  Needs screening TCS age 42  . DILATION AND EVACUATION  01/16/2012   Procedure: DILATATION AND EVACUATION;  Surgeon: Lenoard Adenichard J Taavon, MD;  Location: [facility];  Service: Gynecology;  Laterality: N/A;   . FEMORAL HERNIA REPAIR  2009   left  . LEEP  2005    Outpatient Medications Prior to Visit  Medication Sig Dispense Refill  . clobetasol (OLUX) 0.05 % topical foam Apply 1 application topically daily as needed.  5  . fexofenadine (ALLEGRA) 180 MG tablet Take 180 mg by mouth daily.    . Multiple Vitamin (MULTIVITAMIN) tablet Take 1 tablet by mouth daily.    Marland Kitchen. triamcinolone cream (KENALOG) 0.1 % Apply 1 application topically as needed.     Marland Kitchen. azithromycin (ZITHROMAX) 500 MG tablet Take 1 tablet (500 mg total) by mouth daily. (Patient not taking: Reported on 08/12/2017) 5 tablet 0  . ILEVRO 0.3 % ophthalmic suspension Place 1 drop into both eyes as needed.     Marland Kitchen. omeprazole (PRILOSEC OTC) 20 MG tablet Take 20 mg by mouth daily.    . Probiotic Product (PROBIOTIC DAILY PO) Take by mouth.    . valACYclovir (VALTREX) 500 MG tablet Take 500 mg by mouth 2 (two) times daily as needed.     . predniSONE (DELTASONE) 20 MG tablet Take 1 tablet (20 mg total) by mouth 2 (two) times daily with a meal. (Patient not taking: Reported on 08/12/2017) 10 tablet 00   No facility-administered medications prior to visit.     Allergies  Allergen Reactions  . Albuterol Shortness Of Breath    ?rebound bronchospasm when used q3-4 hours?  . Codeine Itching and Nausea And Vomiting  Can tolerate Tramadol  . Penicillins Hives and Swelling  . Sulfa Antibiotics Swelling  . Trimethoprim Swelling    ROS As per HPI  PE: Blood pressure 118/88, pulse 99, temperature 98.5 F (36.9 C), temperature source Oral, resp. rate 16, height 5\' 10"  (1.778 m), weight 176 lb (79.8 kg), last menstrual period 08/05/2017, SpO2 99 %, currently breastfeeding. Gen: Alert, well appearing.  Patient is oriented to person, place, time, and situation. AFFECT: pleasant, lucid thought and speech. VS: noted--normal. Gen: alert, NAD, NONTOXIC APPEARING. HEENT: eyes without injection, drainage, or swelling.  Ears: EACs clear, TMs with normal  light reflex and landmarks.  Nose: Scant clear rhinorrhea, with some dried, crusty exudate adherent to mildly injected mucosa.  No purulent d/c.  No paranasal sinus TTP.  No facial swelling.  Throat and mouth without focal lesion.  No pharyngial swelling, erythema, or exudate.  I don't really appreciate any swelling of the tongue. Neck: supple, no LAD.   LUNGS: CTA bilat, nonlabored resps.   CV: RRR, no m/r/g. EXT: no c/c/e SKIN: no rash  LABS:  None today.  IMPRESSION AND PLAN:  1) Prolonged allergic rhinitis/URI sx's-->seem to always wax and wane. I don't get the sense of acute bacterial sinusitis today but will check sinus x-ray. Will refer to allergist for further evaluation of her allergic rhinitis. I feel like her tongue swelling is related more to allergy to cephalexin than anything else--I don't see any tongue swelling today. Prednisone 40 qd x 5d, then 20 qd x 5d.  An After Visit Summary was printed and given to the patient.  FOLLOW UP: Return if symptoms worsen or fail to improve.  Signed:  Santiago Bumpers, MD           08/13/2017

## 2017-08-13 ENCOUNTER — Encounter: Payer: Self-pay | Admitting: Family Medicine

## 2017-08-15 DIAGNOSIS — R05 Cough: Secondary | ICD-10-CM | POA: Diagnosis not present

## 2017-08-15 DIAGNOSIS — J3089 Other allergic rhinitis: Secondary | ICD-10-CM | POA: Diagnosis not present

## 2017-08-15 DIAGNOSIS — R21 Rash and other nonspecific skin eruption: Secondary | ICD-10-CM | POA: Diagnosis not present

## 2017-08-15 DIAGNOSIS — J301 Allergic rhinitis due to pollen: Secondary | ICD-10-CM | POA: Diagnosis not present

## 2017-08-22 DIAGNOSIS — J301 Allergic rhinitis due to pollen: Secondary | ICD-10-CM | POA: Diagnosis not present

## 2017-08-23 ENCOUNTER — Other Ambulatory Visit: Payer: Self-pay | Admitting: Family Medicine

## 2017-08-23 ENCOUNTER — Ambulatory Visit: Payer: Self-pay | Admitting: Family Medicine

## 2017-08-23 DIAGNOSIS — J301 Allergic rhinitis due to pollen: Secondary | ICD-10-CM | POA: Diagnosis not present

## 2017-08-23 DIAGNOSIS — J3089 Other allergic rhinitis: Secondary | ICD-10-CM | POA: Diagnosis not present

## 2017-08-23 MED ORDER — PREDNISONE 20 MG PO TABS: ORAL_TABLET | ORAL | 0 refills | Status: DC

## 2017-08-23 NOTE — Telephone Encounter (Signed)
OK, I'll eRx prednisone now.

## 2017-08-23 NOTE — Telephone Encounter (Signed)
(  OK.  Since this has not shown signs of being an angioedema or anaphylactic type of reaction and no signs of progression to additional sx's in the last few days, AND it was much improved while on prednisone, I feel like continuing prednisone is ok.  Pt is a Administrator, [employer] and has high medical IQ-->I trust her history-giving and her judgement.)  If patient is comfortable with getting back on a taper of prednisone, I will eRx this. If she would rather come in to the office for evaluation then I encourage her to do that.

## 2017-08-23 NOTE — Telephone Encounter (Signed)
Pt advised and voiced understanding.  She would like to try the prednisone taper and if no improvement she will come in for evaluation.   Please advise. Thanks.

## 2017-08-23 NOTE — Telephone Encounter (Signed)
Please advise. Thanks.  

## 2017-08-23 NOTE — Telephone Encounter (Signed)
I returned her call.   She is c/o having a rash she thinks developed from the Cleocin she was on for a sinus infection Dr. Milinda CaveMcGowen prescribed for her.   The rash started Tuesday morning and by Wednesday evening it had spread covering her trunk, sides and part of her right arm.   It is itching.   She completed the course of prednisone Dr. Milinda CaveMcGowen prescribed.    She had some prednisone left over from another illness that she took for 2 days that helped the rash.   Denies any other symptoms other than the itching.  She took her last dose of Cleocin on Tuesday morning. She saw an allergist last Thursday at Dr. Samul DadaMcGowen's request.  The allergist started me on Zyzol (? Sp) when I asked her if she had tried any Benadryl.    I routed a note to Dr. Milinda CaveMcGowen making him aware of her situation.   I let her know someone would be in contact with her.   Reason for Disposition . Caller has URGENT medication question about med that PCP prescribed and triager unable to answer question  Answer Assessment - Initial Assessment Questions 1. SYMPTOMS: "Do you have any symptoms?"     I'm on Cleocin for a sinus infection.   The rash started Tuesday evening and by Wednesday evening it started on my chest under my breasts onto my belly and down to my pubic area sides and back and now on right  Arm.   I was on Prednisone finished it Monday.   But I took some prednisone from a different prescription.   It helped.    The rash is itching.   It's fine red speckles.  It's  slightly raised.   No blisters, crust or swelling.    No swelling in throat or tongue.  No shortness of breath.   Swallowing fine.   The infection symptoms are gone completely.     Took last dose of Cleocin on Tuesday morning.  2. SEVERITY: If symptoms are present, ask "Are they mild, moderate or severe?"     I'm taking Zyzal  since the rash started.    I saw an allergist on last Thursday.   He started the Zyzal.  Protocols used: MEDICATION QUESTION CALL-A-AH

## 2017-08-26 ENCOUNTER — Encounter: Payer: Self-pay | Admitting: Family Medicine

## 2017-09-02 ENCOUNTER — Encounter: Payer: Self-pay | Admitting: Family Medicine

## 2017-09-18 DIAGNOSIS — J301 Allergic rhinitis due to pollen: Secondary | ICD-10-CM | POA: Diagnosis not present

## 2017-09-18 DIAGNOSIS — J3089 Other allergic rhinitis: Secondary | ICD-10-CM | POA: Diagnosis not present

## 2017-09-20 DIAGNOSIS — J301 Allergic rhinitis due to pollen: Secondary | ICD-10-CM | POA: Diagnosis not present

## 2017-09-20 DIAGNOSIS — J3089 Other allergic rhinitis: Secondary | ICD-10-CM | POA: Diagnosis not present

## 2017-09-23 DIAGNOSIS — J3089 Other allergic rhinitis: Secondary | ICD-10-CM | POA: Diagnosis not present

## 2017-09-23 DIAGNOSIS — J301 Allergic rhinitis due to pollen: Secondary | ICD-10-CM | POA: Diagnosis not present

## 2017-09-25 DIAGNOSIS — J301 Allergic rhinitis due to pollen: Secondary | ICD-10-CM | POA: Diagnosis not present

## 2017-09-25 DIAGNOSIS — J3089 Other allergic rhinitis: Secondary | ICD-10-CM | POA: Diagnosis not present

## 2017-09-27 DIAGNOSIS — J301 Allergic rhinitis due to pollen: Secondary | ICD-10-CM | POA: Diagnosis not present

## 2017-09-27 DIAGNOSIS — J3089 Other allergic rhinitis: Secondary | ICD-10-CM | POA: Diagnosis not present

## 2017-10-02 DIAGNOSIS — J301 Allergic rhinitis due to pollen: Secondary | ICD-10-CM | POA: Diagnosis not present

## 2017-10-02 DIAGNOSIS — J3089 Other allergic rhinitis: Secondary | ICD-10-CM | POA: Diagnosis not present

## 2017-10-04 DIAGNOSIS — J3089 Other allergic rhinitis: Secondary | ICD-10-CM | POA: Diagnosis not present

## 2017-10-04 DIAGNOSIS — J301 Allergic rhinitis due to pollen: Secondary | ICD-10-CM | POA: Diagnosis not present

## 2017-10-09 DIAGNOSIS — J3089 Other allergic rhinitis: Secondary | ICD-10-CM | POA: Diagnosis not present

## 2017-10-09 DIAGNOSIS — J301 Allergic rhinitis due to pollen: Secondary | ICD-10-CM | POA: Diagnosis not present

## 2017-10-16 DIAGNOSIS — J3089 Other allergic rhinitis: Secondary | ICD-10-CM | POA: Diagnosis not present

## 2017-10-16 DIAGNOSIS — J301 Allergic rhinitis due to pollen: Secondary | ICD-10-CM | POA: Diagnosis not present

## 2017-10-18 DIAGNOSIS — J301 Allergic rhinitis due to pollen: Secondary | ICD-10-CM | POA: Diagnosis not present

## 2017-10-18 DIAGNOSIS — J3089 Other allergic rhinitis: Secondary | ICD-10-CM | POA: Diagnosis not present

## 2017-10-23 DIAGNOSIS — J3089 Other allergic rhinitis: Secondary | ICD-10-CM | POA: Diagnosis not present

## 2017-10-23 DIAGNOSIS — J301 Allergic rhinitis due to pollen: Secondary | ICD-10-CM | POA: Diagnosis not present

## 2017-10-25 DIAGNOSIS — J301 Allergic rhinitis due to pollen: Secondary | ICD-10-CM | POA: Diagnosis not present

## 2017-10-25 DIAGNOSIS — J3089 Other allergic rhinitis: Secondary | ICD-10-CM | POA: Diagnosis not present

## 2017-10-30 DIAGNOSIS — J301 Allergic rhinitis due to pollen: Secondary | ICD-10-CM | POA: Diagnosis not present

## 2017-10-30 DIAGNOSIS — J3089 Other allergic rhinitis: Secondary | ICD-10-CM | POA: Diagnosis not present

## 2017-11-01 DIAGNOSIS — J301 Allergic rhinitis due to pollen: Secondary | ICD-10-CM | POA: Diagnosis not present

## 2017-11-01 DIAGNOSIS — J3089 Other allergic rhinitis: Secondary | ICD-10-CM | POA: Diagnosis not present

## 2017-11-04 DIAGNOSIS — J3089 Other allergic rhinitis: Secondary | ICD-10-CM | POA: Diagnosis not present

## 2017-11-04 DIAGNOSIS — J301 Allergic rhinitis due to pollen: Secondary | ICD-10-CM | POA: Diagnosis not present

## 2017-11-06 DIAGNOSIS — J3089 Other allergic rhinitis: Secondary | ICD-10-CM | POA: Diagnosis not present

## 2017-11-06 DIAGNOSIS — J301 Allergic rhinitis due to pollen: Secondary | ICD-10-CM | POA: Diagnosis not present

## 2017-11-20 DIAGNOSIS — J3089 Other allergic rhinitis: Secondary | ICD-10-CM | POA: Diagnosis not present

## 2017-11-20 DIAGNOSIS — J301 Allergic rhinitis due to pollen: Secondary | ICD-10-CM | POA: Diagnosis not present

## 2017-11-25 DIAGNOSIS — J3089 Other allergic rhinitis: Secondary | ICD-10-CM | POA: Diagnosis not present

## 2017-11-25 DIAGNOSIS — J301 Allergic rhinitis due to pollen: Secondary | ICD-10-CM | POA: Diagnosis not present

## 2017-11-27 DIAGNOSIS — J3089 Other allergic rhinitis: Secondary | ICD-10-CM | POA: Diagnosis not present

## 2017-11-27 DIAGNOSIS — J301 Allergic rhinitis due to pollen: Secondary | ICD-10-CM | POA: Diagnosis not present

## 2017-12-02 DIAGNOSIS — J3089 Other allergic rhinitis: Secondary | ICD-10-CM | POA: Diagnosis not present

## 2017-12-02 DIAGNOSIS — J301 Allergic rhinitis due to pollen: Secondary | ICD-10-CM | POA: Diagnosis not present

## 2017-12-09 DIAGNOSIS — J3089 Other allergic rhinitis: Secondary | ICD-10-CM | POA: Diagnosis not present

## 2017-12-09 DIAGNOSIS — J301 Allergic rhinitis due to pollen: Secondary | ICD-10-CM | POA: Diagnosis not present

## 2017-12-11 DIAGNOSIS — J3089 Other allergic rhinitis: Secondary | ICD-10-CM | POA: Diagnosis not present

## 2017-12-11 DIAGNOSIS — J301 Allergic rhinitis due to pollen: Secondary | ICD-10-CM | POA: Diagnosis not present

## 2017-12-16 DIAGNOSIS — J301 Allergic rhinitis due to pollen: Secondary | ICD-10-CM | POA: Diagnosis not present

## 2017-12-16 DIAGNOSIS — J3089 Other allergic rhinitis: Secondary | ICD-10-CM | POA: Diagnosis not present

## 2017-12-17 DIAGNOSIS — R21 Rash and other nonspecific skin eruption: Secondary | ICD-10-CM | POA: Diagnosis not present

## 2017-12-17 DIAGNOSIS — K140 Glossitis: Secondary | ICD-10-CM | POA: Diagnosis not present

## 2017-12-17 DIAGNOSIS — Z880 Allergy status to penicillin: Secondary | ICD-10-CM | POA: Diagnosis not present

## 2017-12-17 DIAGNOSIS — R05 Cough: Secondary | ICD-10-CM | POA: Diagnosis not present

## 2017-12-23 DIAGNOSIS — J3089 Other allergic rhinitis: Secondary | ICD-10-CM | POA: Diagnosis not present

## 2017-12-23 DIAGNOSIS — J301 Allergic rhinitis due to pollen: Secondary | ICD-10-CM | POA: Diagnosis not present

## 2017-12-25 DIAGNOSIS — J3089 Other allergic rhinitis: Secondary | ICD-10-CM | POA: Diagnosis not present

## 2017-12-25 DIAGNOSIS — J301 Allergic rhinitis due to pollen: Secondary | ICD-10-CM | POA: Diagnosis not present

## 2017-12-30 ENCOUNTER — Encounter: Payer: Self-pay | Admitting: Family Medicine

## 2017-12-30 DIAGNOSIS — J3089 Other allergic rhinitis: Secondary | ICD-10-CM | POA: Diagnosis not present

## 2017-12-30 DIAGNOSIS — J301 Allergic rhinitis due to pollen: Secondary | ICD-10-CM | POA: Diagnosis not present

## 2018-01-01 DIAGNOSIS — J3089 Other allergic rhinitis: Secondary | ICD-10-CM | POA: Diagnosis not present

## 2018-01-01 DIAGNOSIS — J301 Allergic rhinitis due to pollen: Secondary | ICD-10-CM | POA: Diagnosis not present

## 2018-01-01 DIAGNOSIS — Z23 Encounter for immunization: Secondary | ICD-10-CM | POA: Diagnosis not present

## 2018-01-08 DIAGNOSIS — J301 Allergic rhinitis due to pollen: Secondary | ICD-10-CM | POA: Diagnosis not present

## 2018-01-08 DIAGNOSIS — J3089 Other allergic rhinitis: Secondary | ICD-10-CM | POA: Diagnosis not present

## 2018-01-15 DIAGNOSIS — J301 Allergic rhinitis due to pollen: Secondary | ICD-10-CM | POA: Diagnosis not present

## 2018-01-15 DIAGNOSIS — J3089 Other allergic rhinitis: Secondary | ICD-10-CM | POA: Diagnosis not present

## 2018-01-22 DIAGNOSIS — J301 Allergic rhinitis due to pollen: Secondary | ICD-10-CM | POA: Diagnosis not present

## 2018-01-22 DIAGNOSIS — J3089 Other allergic rhinitis: Secondary | ICD-10-CM | POA: Diagnosis not present

## 2018-01-23 DIAGNOSIS — J3089 Other allergic rhinitis: Secondary | ICD-10-CM | POA: Diagnosis not present

## 2018-01-29 DIAGNOSIS — J3089 Other allergic rhinitis: Secondary | ICD-10-CM | POA: Diagnosis not present

## 2018-01-29 DIAGNOSIS — J301 Allergic rhinitis due to pollen: Secondary | ICD-10-CM | POA: Diagnosis not present

## 2018-02-11 DIAGNOSIS — J3089 Other allergic rhinitis: Secondary | ICD-10-CM | POA: Diagnosis not present

## 2018-02-11 DIAGNOSIS — J301 Allergic rhinitis due to pollen: Secondary | ICD-10-CM | POA: Diagnosis not present

## 2018-02-20 DIAGNOSIS — J3089 Other allergic rhinitis: Secondary | ICD-10-CM | POA: Diagnosis not present

## 2018-02-20 DIAGNOSIS — J301 Allergic rhinitis due to pollen: Secondary | ICD-10-CM | POA: Diagnosis not present

## 2018-02-26 DIAGNOSIS — J301 Allergic rhinitis due to pollen: Secondary | ICD-10-CM | POA: Diagnosis not present

## 2018-02-26 DIAGNOSIS — J3089 Other allergic rhinitis: Secondary | ICD-10-CM | POA: Diagnosis not present

## 2018-03-07 DIAGNOSIS — J301 Allergic rhinitis due to pollen: Secondary | ICD-10-CM | POA: Diagnosis not present

## 2018-03-07 DIAGNOSIS — J3089 Other allergic rhinitis: Secondary | ICD-10-CM | POA: Diagnosis not present

## 2018-03-13 DIAGNOSIS — J3089 Other allergic rhinitis: Secondary | ICD-10-CM | POA: Diagnosis not present

## 2018-03-13 DIAGNOSIS — J301 Allergic rhinitis due to pollen: Secondary | ICD-10-CM | POA: Diagnosis not present

## 2018-03-17 DIAGNOSIS — J3089 Other allergic rhinitis: Secondary | ICD-10-CM | POA: Diagnosis not present

## 2018-03-17 DIAGNOSIS — J301 Allergic rhinitis due to pollen: Secondary | ICD-10-CM | POA: Diagnosis not present

## 2018-03-27 DIAGNOSIS — J3089 Other allergic rhinitis: Secondary | ICD-10-CM | POA: Diagnosis not present

## 2018-03-27 DIAGNOSIS — J301 Allergic rhinitis due to pollen: Secondary | ICD-10-CM | POA: Diagnosis not present

## 2018-04-02 DIAGNOSIS — J3089 Other allergic rhinitis: Secondary | ICD-10-CM | POA: Diagnosis not present

## 2018-04-02 DIAGNOSIS — J301 Allergic rhinitis due to pollen: Secondary | ICD-10-CM | POA: Diagnosis not present

## 2018-04-04 DIAGNOSIS — J3089 Other allergic rhinitis: Secondary | ICD-10-CM | POA: Diagnosis not present

## 2018-04-04 DIAGNOSIS — J301 Allergic rhinitis due to pollen: Secondary | ICD-10-CM | POA: Diagnosis not present

## 2018-04-08 DIAGNOSIS — J3089 Other allergic rhinitis: Secondary | ICD-10-CM | POA: Diagnosis not present

## 2018-04-08 DIAGNOSIS — J301 Allergic rhinitis due to pollen: Secondary | ICD-10-CM | POA: Diagnosis not present

## 2018-04-16 DIAGNOSIS — J301 Allergic rhinitis due to pollen: Secondary | ICD-10-CM | POA: Diagnosis not present

## 2018-04-16 DIAGNOSIS — J3089 Other allergic rhinitis: Secondary | ICD-10-CM | POA: Diagnosis not present

## 2018-04-22 DIAGNOSIS — J3089 Other allergic rhinitis: Secondary | ICD-10-CM | POA: Diagnosis not present

## 2018-04-22 DIAGNOSIS — J301 Allergic rhinitis due to pollen: Secondary | ICD-10-CM | POA: Diagnosis not present

## 2018-04-28 DIAGNOSIS — J301 Allergic rhinitis due to pollen: Secondary | ICD-10-CM | POA: Diagnosis not present

## 2018-04-28 DIAGNOSIS — J3089 Other allergic rhinitis: Secondary | ICD-10-CM | POA: Diagnosis not present

## 2018-05-06 DIAGNOSIS — J301 Allergic rhinitis due to pollen: Secondary | ICD-10-CM | POA: Diagnosis not present

## 2018-05-06 DIAGNOSIS — J3089 Other allergic rhinitis: Secondary | ICD-10-CM | POA: Diagnosis not present

## 2018-05-12 DIAGNOSIS — H04123 Dry eye syndrome of bilateral lacrimal glands: Secondary | ICD-10-CM | POA: Diagnosis not present

## 2018-05-12 DIAGNOSIS — L718 Other rosacea: Secondary | ICD-10-CM | POA: Diagnosis not present

## 2018-05-22 ENCOUNTER — Ambulatory Visit: Payer: BLUE CROSS/BLUE SHIELD | Admitting: Family Medicine

## 2018-05-22 ENCOUNTER — Other Ambulatory Visit: Payer: Self-pay

## 2018-05-22 ENCOUNTER — Encounter: Payer: Self-pay | Admitting: Family Medicine

## 2018-05-22 VITALS — BP 126/81 | HR 100 | Temp 101.1°F | Resp 16 | Ht 70.0 in | Wt 173.8 lb

## 2018-05-22 DIAGNOSIS — R509 Fever, unspecified: Secondary | ICD-10-CM | POA: Diagnosis not present

## 2018-05-22 DIAGNOSIS — J019 Acute sinusitis, unspecified: Secondary | ICD-10-CM | POA: Diagnosis not present

## 2018-05-22 DIAGNOSIS — R52 Pain, unspecified: Secondary | ICD-10-CM | POA: Diagnosis not present

## 2018-05-22 LAB — POCT INFLUENZA A/B
INFLUENZA A, POC: NEGATIVE
INFLUENZA B, POC: NEGATIVE

## 2018-05-22 MED ORDER — CEFDINIR 300 MG PO CAPS: 300.0000 mg | ORAL_CAPSULE | Freq: Two times a day (BID) | ORAL | 0 refills | Status: AC

## 2018-05-22 MED ORDER — MUPIROCIN 2 % EX OINT: TOPICAL_OINTMENT | CUTANEOUS | 0 refills | Status: DC

## 2018-05-22 NOTE — Progress Notes (Signed)
OFFICE NOTE  05/22/2018  CC:  Chief Complaint  Patient presents with  . URI    x 7 days   HPI:   Patient is a 43 y.o. Caucasian female who is here for respiratory complaints. Tired, feverish,and  lethargic onset 7 days ago, runny nose, cough, nasal d/c turned disgusting. FAce starting to hurt.  Fever has been daily 99-102.  Tyl/mot helps intermittently. Sudafed no help.  Allegra no signif help. Has been getting immunotherapy for allergies. Amoxil challenge--not allergic to amoxil after all. Achy body.  No rash.    Son with similar illness recently: flu neg    Pertinent PMH:  Past Medical History:  Diagnosis Date  . Allergic rhinitis    longstanding hx: 2011 allergist eval + allergy testing unremarkable.  2017 CT sinuses unremarkable.  2019 sinus x-rays + acute infection.  Allergist eval 08/2017---immunotherapy started after and is ongoing as of 12/2017 allergist f/u.  Marland Kitchen Dermatitis herpetiformis 2015/16   Celiac blood tests negative; skin bx neg for dermatitis herpetiformis dx  . GERD (gastroesophageal reflux disease)    pregnancy only  . Gestational hypertension without significant proteinuria in third trimester 11/09/2010  . HSV (herpes simplex virus) anogenital infection   . Hx of allergy to penicillin    PT PASSED PCN CHALLENGE AT ALLERGIST'S 12/17/17 AND SHOULD NO LONGER BE CONSIDERED PENICILLIN-ALLERGIC (SAME RISK AS GENERAL POPULATION FOR IgE MEDIATED PENICILLIN ALLERGY  . Lactose intolerance   . Palpitations    stress related palpitations  . Psoriasis    occ left knee pain WITHOUT swelling--resolves with use of CLARITIN (??.)  No hx suspicious for psoriatic arthritis.  . Tongue swelling    Mild/fluctuating--seems to be connected to aeroallergen exposure, responds to antihistamine use.  Marland Kitchen Upper airway cough syndrome 2016/2017   Saw Dr. Sherene Sires 2017  . Vitamin D deficiency      MEDS;   Outpatient Medications Prior to Visit  Medication Sig Dispense Refill  .  clindamycin (CLEOCIN) 300 MG capsule 1 tab po tid with food 42 capsule 0  . clobetasol (OLUX) 0.05 % topical foam Apply 1 application topically daily as needed.  5  . fexofenadine (ALLEGRA) 180 MG tablet Take 180 mg by mouth daily.    . ILEVRO 0.3 % ophthalmic suspension Place 1 drop into both eyes as needed.     . Multiple Vitamin (MULTIVITAMIN) tablet Take 1 tablet by mouth daily.    Marland Kitchen omeprazole (PRILOSEC OTC) 20 MG tablet Take 20 mg by mouth daily.    . Probiotic Product (PROBIOTIC DAILY PO) Take by mouth.    . triamcinolone cream (KENALOG) 0.1 % Apply 1 application topically as needed.     . valACYclovir (VALTREX) 500 MG tablet Take 500 mg by mouth 2 (two) times daily as needed.     Marland Kitchen azithromycin (ZITHROMAX) 500 MG tablet Take 1 tablet (500 mg total) by mouth daily. (Patient not taking: Reported on 08/12/2017) 5 tablet 0  . predniSONE (DELTASONE) 20 MG tablet 2 tabs po qd x 5d, then 1 tab po qd x 5d, then 1/2 tab po qd x 4d (Patient not taking: Reported on 05/22/2018) 17 tablet 0   No facility-administered medications prior to visit.     PE: Blood pressure 126/81, pulse 100, temperature (!) 101.1 F (38.4 C), temperature source Oral, resp. rate 16, height 5\' 10"  (1.778 m), weight 173 lb 12.8 oz (78.8 kg), SpO2 100 %, currently breastfeeding. Body mass index is 24.94 kg/m.  VS: noted--normal. Gen: alert, NAD,  NONTOXIC APPEARING. HEENT: eyes without injection, drainage, or swelling.  Ears: EACs clear, TMs with normal light reflex and landmarks.  Nose: Scant clear/yellowish rhinorrhea, with some dried, crusty exudate adherent to mildly injected mucosa. borders of nostril openings are eythematous and tender and this appears to extend into distal aspect of both nasal passages.  No mucosal ulcerations appreciated.  No purulent d/c. Mild R frontal and R paranasal sinus TTP.  No facial swelling.  Throat and mouth without focal lesion.  No pharyngial swelling, erythema, or exudate.   Neck: supple, no  LAD.   LUNGS: CTA bilat, nonlabored resps.   CV: RRR, no m/r/g. EXT: no c/c/e SKIN: no rash  Rapid FLu A/B NEG  IMPRESSION AND PLAN:  1) Acute sinusitis with fever. Flu neg. Cefdinir 300mg  bid x10d.  If improved but not all the way well in 10d then she'll call and I will extend her abx 1 more week.  I also recommended bactroban ointment to apply tid x 10d to the irritated and potentially infected areas of the nose.  Saline nasal spray encouraged, as is trial of antihist +/-decong.   An After Visit Summary was printed and given to the patient.  FOLLOW UP:  Return if symptoms worsen or fail to improve.  Signed:  Santiago Bumpers, MD           05/22/2018

## 2018-05-26 DIAGNOSIS — J301 Allergic rhinitis due to pollen: Secondary | ICD-10-CM | POA: Diagnosis not present

## 2018-05-26 DIAGNOSIS — J3089 Other allergic rhinitis: Secondary | ICD-10-CM | POA: Diagnosis not present

## 2018-06-03 DIAGNOSIS — J301 Allergic rhinitis due to pollen: Secondary | ICD-10-CM | POA: Diagnosis not present

## 2018-06-03 DIAGNOSIS — J3089 Other allergic rhinitis: Secondary | ICD-10-CM | POA: Diagnosis not present

## 2018-06-10 DIAGNOSIS — J301 Allergic rhinitis due to pollen: Secondary | ICD-10-CM | POA: Diagnosis not present

## 2018-06-10 DIAGNOSIS — J3089 Other allergic rhinitis: Secondary | ICD-10-CM | POA: Diagnosis not present

## 2018-06-17 DIAGNOSIS — J301 Allergic rhinitis due to pollen: Secondary | ICD-10-CM | POA: Diagnosis not present

## 2018-06-17 DIAGNOSIS — J3089 Other allergic rhinitis: Secondary | ICD-10-CM | POA: Diagnosis not present

## 2018-06-23 DIAGNOSIS — J301 Allergic rhinitis due to pollen: Secondary | ICD-10-CM | POA: Diagnosis not present

## 2018-06-23 DIAGNOSIS — J3089 Other allergic rhinitis: Secondary | ICD-10-CM | POA: Diagnosis not present

## 2018-06-24 DIAGNOSIS — Z6825 Body mass index (BMI) 25.0-25.9, adult: Secondary | ICD-10-CM | POA: Diagnosis not present

## 2018-06-24 DIAGNOSIS — Z01419 Encounter for gynecological examination (general) (routine) without abnormal findings: Secondary | ICD-10-CM | POA: Diagnosis not present

## 2018-06-24 DIAGNOSIS — Z124 Encounter for screening for malignant neoplasm of cervix: Secondary | ICD-10-CM | POA: Diagnosis not present

## 2018-06-24 DIAGNOSIS — Z1151 Encounter for screening for human papillomavirus (HPV): Secondary | ICD-10-CM | POA: Diagnosis not present

## 2018-06-24 DIAGNOSIS — Z113 Encounter for screening for infections with a predominantly sexual mode of transmission: Secondary | ICD-10-CM | POA: Diagnosis not present

## 2018-06-24 DIAGNOSIS — Z118 Encounter for screening for other infectious and parasitic diseases: Secondary | ICD-10-CM | POA: Diagnosis not present

## 2018-07-01 DIAGNOSIS — J3089 Other allergic rhinitis: Secondary | ICD-10-CM | POA: Diagnosis not present

## 2018-07-01 DIAGNOSIS — J301 Allergic rhinitis due to pollen: Secondary | ICD-10-CM | POA: Diagnosis not present

## 2018-07-09 DIAGNOSIS — J301 Allergic rhinitis due to pollen: Secondary | ICD-10-CM | POA: Diagnosis not present

## 2018-07-09 DIAGNOSIS — J3089 Other allergic rhinitis: Secondary | ICD-10-CM | POA: Diagnosis not present

## 2018-07-14 DIAGNOSIS — J3089 Other allergic rhinitis: Secondary | ICD-10-CM | POA: Diagnosis not present

## 2018-07-14 DIAGNOSIS — J301 Allergic rhinitis due to pollen: Secondary | ICD-10-CM | POA: Diagnosis not present

## 2018-07-18 DIAGNOSIS — J301 Allergic rhinitis due to pollen: Secondary | ICD-10-CM | POA: Diagnosis not present

## 2018-07-21 DIAGNOSIS — J3089 Other allergic rhinitis: Secondary | ICD-10-CM | POA: Diagnosis not present

## 2018-07-23 DIAGNOSIS — J3089 Other allergic rhinitis: Secondary | ICD-10-CM | POA: Diagnosis not present

## 2018-07-23 DIAGNOSIS — J301 Allergic rhinitis due to pollen: Secondary | ICD-10-CM | POA: Diagnosis not present

## 2018-07-30 DIAGNOSIS — J301 Allergic rhinitis due to pollen: Secondary | ICD-10-CM | POA: Diagnosis not present

## 2018-07-30 DIAGNOSIS — J3089 Other allergic rhinitis: Secondary | ICD-10-CM | POA: Diagnosis not present

## 2018-08-07 DIAGNOSIS — J3089 Other allergic rhinitis: Secondary | ICD-10-CM | POA: Diagnosis not present

## 2018-08-07 DIAGNOSIS — J301 Allergic rhinitis due to pollen: Secondary | ICD-10-CM | POA: Diagnosis not present

## 2018-08-11 DIAGNOSIS — Z1231 Encounter for screening mammogram for malignant neoplasm of breast: Secondary | ICD-10-CM | POA: Diagnosis not present

## 2018-08-13 DIAGNOSIS — J3089 Other allergic rhinitis: Secondary | ICD-10-CM | POA: Diagnosis not present

## 2018-08-13 DIAGNOSIS — J301 Allergic rhinitis due to pollen: Secondary | ICD-10-CM | POA: Diagnosis not present

## 2018-08-18 DIAGNOSIS — J3089 Other allergic rhinitis: Secondary | ICD-10-CM | POA: Diagnosis not present

## 2018-08-18 DIAGNOSIS — J301 Allergic rhinitis due to pollen: Secondary | ICD-10-CM | POA: Diagnosis not present

## 2018-08-20 DIAGNOSIS — J301 Allergic rhinitis due to pollen: Secondary | ICD-10-CM | POA: Diagnosis not present

## 2018-08-20 DIAGNOSIS — J3089 Other allergic rhinitis: Secondary | ICD-10-CM | POA: Diagnosis not present

## 2018-08-25 DIAGNOSIS — J3089 Other allergic rhinitis: Secondary | ICD-10-CM | POA: Diagnosis not present

## 2018-08-25 DIAGNOSIS — J301 Allergic rhinitis due to pollen: Secondary | ICD-10-CM | POA: Diagnosis not present

## 2018-08-27 DIAGNOSIS — J301 Allergic rhinitis due to pollen: Secondary | ICD-10-CM | POA: Diagnosis not present

## 2018-08-27 DIAGNOSIS — J3089 Other allergic rhinitis: Secondary | ICD-10-CM | POA: Diagnosis not present

## 2018-09-05 DIAGNOSIS — J3089 Other allergic rhinitis: Secondary | ICD-10-CM | POA: Diagnosis not present

## 2018-09-05 DIAGNOSIS — J301 Allergic rhinitis due to pollen: Secondary | ICD-10-CM | POA: Diagnosis not present

## 2018-09-08 DIAGNOSIS — S90811A Abrasion, right foot, initial encounter: Secondary | ICD-10-CM | POA: Diagnosis not present

## 2018-09-08 DIAGNOSIS — S91311A Laceration without foreign body, right foot, initial encounter: Secondary | ICD-10-CM | POA: Diagnosis not present

## 2018-09-08 DIAGNOSIS — S91115A Laceration without foreign body of left lesser toe(s) without damage to nail, initial encounter: Secondary | ICD-10-CM | POA: Diagnosis not present

## 2018-09-08 DIAGNOSIS — S91312A Laceration without foreign body, left foot, initial encounter: Secondary | ICD-10-CM | POA: Diagnosis not present

## 2018-09-08 DIAGNOSIS — Z23 Encounter for immunization: Secondary | ICD-10-CM | POA: Diagnosis not present

## 2018-09-18 DIAGNOSIS — J301 Allergic rhinitis due to pollen: Secondary | ICD-10-CM | POA: Diagnosis not present

## 2018-09-18 DIAGNOSIS — J3089 Other allergic rhinitis: Secondary | ICD-10-CM | POA: Diagnosis not present

## 2018-09-24 DIAGNOSIS — J3089 Other allergic rhinitis: Secondary | ICD-10-CM | POA: Diagnosis not present

## 2018-09-24 DIAGNOSIS — J301 Allergic rhinitis due to pollen: Secondary | ICD-10-CM | POA: Diagnosis not present

## 2018-10-01 DIAGNOSIS — J3089 Other allergic rhinitis: Secondary | ICD-10-CM | POA: Diagnosis not present

## 2018-10-01 DIAGNOSIS — J301 Allergic rhinitis due to pollen: Secondary | ICD-10-CM | POA: Diagnosis not present

## 2018-10-10 DIAGNOSIS — J3089 Other allergic rhinitis: Secondary | ICD-10-CM | POA: Diagnosis not present

## 2018-10-10 DIAGNOSIS — J301 Allergic rhinitis due to pollen: Secondary | ICD-10-CM | POA: Diagnosis not present

## 2018-10-15 DIAGNOSIS — J3089 Other allergic rhinitis: Secondary | ICD-10-CM | POA: Diagnosis not present

## 2018-10-15 DIAGNOSIS — J301 Allergic rhinitis due to pollen: Secondary | ICD-10-CM | POA: Diagnosis not present

## 2018-10-24 DIAGNOSIS — J3089 Other allergic rhinitis: Secondary | ICD-10-CM | POA: Diagnosis not present

## 2018-10-24 DIAGNOSIS — J301 Allergic rhinitis due to pollen: Secondary | ICD-10-CM | POA: Diagnosis not present

## 2018-10-27 DIAGNOSIS — J3089 Other allergic rhinitis: Secondary | ICD-10-CM | POA: Diagnosis not present

## 2018-10-27 DIAGNOSIS — Z880 Allergy status to penicillin: Secondary | ICD-10-CM | POA: Diagnosis not present

## 2018-10-27 DIAGNOSIS — J301 Allergic rhinitis due to pollen: Secondary | ICD-10-CM | POA: Diagnosis not present

## 2018-10-27 DIAGNOSIS — R21 Rash and other nonspecific skin eruption: Secondary | ICD-10-CM | POA: Diagnosis not present

## 2018-10-31 DIAGNOSIS — J3089 Other allergic rhinitis: Secondary | ICD-10-CM | POA: Diagnosis not present

## 2018-10-31 DIAGNOSIS — J301 Allergic rhinitis due to pollen: Secondary | ICD-10-CM | POA: Diagnosis not present

## 2018-11-07 DIAGNOSIS — J301 Allergic rhinitis due to pollen: Secondary | ICD-10-CM | POA: Diagnosis not present

## 2018-11-07 DIAGNOSIS — J3089 Other allergic rhinitis: Secondary | ICD-10-CM | POA: Diagnosis not present

## 2018-11-14 DIAGNOSIS — J301 Allergic rhinitis due to pollen: Secondary | ICD-10-CM | POA: Diagnosis not present

## 2018-11-14 DIAGNOSIS — J3089 Other allergic rhinitis: Secondary | ICD-10-CM | POA: Diagnosis not present

## 2018-11-21 DIAGNOSIS — J3089 Other allergic rhinitis: Secondary | ICD-10-CM | POA: Diagnosis not present

## 2018-11-21 DIAGNOSIS — J301 Allergic rhinitis due to pollen: Secondary | ICD-10-CM | POA: Diagnosis not present

## 2018-11-28 DIAGNOSIS — J301 Allergic rhinitis due to pollen: Secondary | ICD-10-CM | POA: Diagnosis not present

## 2018-11-28 DIAGNOSIS — J3089 Other allergic rhinitis: Secondary | ICD-10-CM | POA: Diagnosis not present

## 2018-12-01 ENCOUNTER — Encounter: Payer: Self-pay | Admitting: Family Medicine

## 2018-12-05 DIAGNOSIS — J301 Allergic rhinitis due to pollen: Secondary | ICD-10-CM | POA: Diagnosis not present

## 2018-12-05 DIAGNOSIS — J3089 Other allergic rhinitis: Secondary | ICD-10-CM | POA: Diagnosis not present

## 2018-12-12 DIAGNOSIS — J301 Allergic rhinitis due to pollen: Secondary | ICD-10-CM | POA: Diagnosis not present

## 2018-12-12 DIAGNOSIS — J3089 Other allergic rhinitis: Secondary | ICD-10-CM | POA: Diagnosis not present

## 2018-12-15 DIAGNOSIS — J3089 Other allergic rhinitis: Secondary | ICD-10-CM | POA: Diagnosis not present

## 2018-12-15 DIAGNOSIS — J301 Allergic rhinitis due to pollen: Secondary | ICD-10-CM | POA: Diagnosis not present

## 2018-12-19 DIAGNOSIS — J3089 Other allergic rhinitis: Secondary | ICD-10-CM | POA: Diagnosis not present

## 2018-12-19 DIAGNOSIS — J301 Allergic rhinitis due to pollen: Secondary | ICD-10-CM | POA: Diagnosis not present

## 2018-12-26 DIAGNOSIS — J301 Allergic rhinitis due to pollen: Secondary | ICD-10-CM | POA: Diagnosis not present

## 2018-12-26 DIAGNOSIS — J3089 Other allergic rhinitis: Secondary | ICD-10-CM | POA: Diagnosis not present

## 2019-01-01 ENCOUNTER — Encounter: Payer: Self-pay | Admitting: Family Medicine

## 2019-01-07 DIAGNOSIS — J3089 Other allergic rhinitis: Secondary | ICD-10-CM | POA: Diagnosis not present

## 2019-01-07 DIAGNOSIS — J301 Allergic rhinitis due to pollen: Secondary | ICD-10-CM | POA: Diagnosis not present

## 2019-01-20 DIAGNOSIS — J301 Allergic rhinitis due to pollen: Secondary | ICD-10-CM | POA: Diagnosis not present

## 2019-01-20 DIAGNOSIS — J3089 Other allergic rhinitis: Secondary | ICD-10-CM | POA: Diagnosis not present

## 2019-01-28 DIAGNOSIS — J3089 Other allergic rhinitis: Secondary | ICD-10-CM | POA: Diagnosis not present

## 2019-01-28 DIAGNOSIS — J301 Allergic rhinitis due to pollen: Secondary | ICD-10-CM | POA: Diagnosis not present

## 2019-01-30 DIAGNOSIS — J3089 Other allergic rhinitis: Secondary | ICD-10-CM | POA: Diagnosis not present

## 2019-01-30 DIAGNOSIS — J301 Allergic rhinitis due to pollen: Secondary | ICD-10-CM | POA: Diagnosis not present

## 2019-02-11 DIAGNOSIS — J301 Allergic rhinitis due to pollen: Secondary | ICD-10-CM | POA: Diagnosis not present

## 2019-02-11 DIAGNOSIS — J3089 Other allergic rhinitis: Secondary | ICD-10-CM | POA: Diagnosis not present

## 2019-02-13 DIAGNOSIS — J3089 Other allergic rhinitis: Secondary | ICD-10-CM | POA: Diagnosis not present

## 2019-02-13 DIAGNOSIS — J301 Allergic rhinitis due to pollen: Secondary | ICD-10-CM | POA: Diagnosis not present

## 2019-02-18 DIAGNOSIS — J3089 Other allergic rhinitis: Secondary | ICD-10-CM | POA: Diagnosis not present

## 2019-02-18 DIAGNOSIS — J301 Allergic rhinitis due to pollen: Secondary | ICD-10-CM | POA: Diagnosis not present

## 2019-02-20 DIAGNOSIS — J3089 Other allergic rhinitis: Secondary | ICD-10-CM | POA: Diagnosis not present

## 2019-02-20 DIAGNOSIS — J301 Allergic rhinitis due to pollen: Secondary | ICD-10-CM | POA: Diagnosis not present

## 2019-02-25 DIAGNOSIS — J3089 Other allergic rhinitis: Secondary | ICD-10-CM | POA: Diagnosis not present

## 2019-02-25 DIAGNOSIS — J301 Allergic rhinitis due to pollen: Secondary | ICD-10-CM | POA: Diagnosis not present

## 2019-03-11 DIAGNOSIS — J301 Allergic rhinitis due to pollen: Secondary | ICD-10-CM | POA: Diagnosis not present

## 2019-03-11 DIAGNOSIS — J3089 Other allergic rhinitis: Secondary | ICD-10-CM | POA: Diagnosis not present

## 2019-03-20 DIAGNOSIS — J3089 Other allergic rhinitis: Secondary | ICD-10-CM | POA: Diagnosis not present

## 2019-03-20 DIAGNOSIS — J301 Allergic rhinitis due to pollen: Secondary | ICD-10-CM | POA: Diagnosis not present

## 2019-03-24 DIAGNOSIS — J301 Allergic rhinitis due to pollen: Secondary | ICD-10-CM | POA: Diagnosis not present

## 2019-03-24 DIAGNOSIS — J3089 Other allergic rhinitis: Secondary | ICD-10-CM | POA: Diagnosis not present

## 2019-03-27 DIAGNOSIS — J301 Allergic rhinitis due to pollen: Secondary | ICD-10-CM | POA: Diagnosis not present

## 2019-03-30 DIAGNOSIS — J3089 Other allergic rhinitis: Secondary | ICD-10-CM | POA: Diagnosis not present

## 2019-04-01 DIAGNOSIS — J3089 Other allergic rhinitis: Secondary | ICD-10-CM | POA: Diagnosis not present

## 2019-04-01 DIAGNOSIS — J301 Allergic rhinitis due to pollen: Secondary | ICD-10-CM | POA: Diagnosis not present

## 2019-04-16 DIAGNOSIS — J3089 Other allergic rhinitis: Secondary | ICD-10-CM | POA: Diagnosis not present

## 2019-04-16 DIAGNOSIS — J301 Allergic rhinitis due to pollen: Secondary | ICD-10-CM | POA: Diagnosis not present

## 2019-04-24 DIAGNOSIS — J301 Allergic rhinitis due to pollen: Secondary | ICD-10-CM | POA: Diagnosis not present

## 2019-04-24 DIAGNOSIS — J3089 Other allergic rhinitis: Secondary | ICD-10-CM | POA: Diagnosis not present

## 2019-05-08 DIAGNOSIS — J3089 Other allergic rhinitis: Secondary | ICD-10-CM | POA: Diagnosis not present

## 2019-05-08 DIAGNOSIS — J301 Allergic rhinitis due to pollen: Secondary | ICD-10-CM | POA: Diagnosis not present

## 2019-05-14 DIAGNOSIS — J301 Allergic rhinitis due to pollen: Secondary | ICD-10-CM | POA: Diagnosis not present

## 2019-05-14 DIAGNOSIS — J3089 Other allergic rhinitis: Secondary | ICD-10-CM | POA: Diagnosis not present

## 2019-05-20 DIAGNOSIS — J3089 Other allergic rhinitis: Secondary | ICD-10-CM | POA: Diagnosis not present

## 2019-05-20 DIAGNOSIS — J301 Allergic rhinitis due to pollen: Secondary | ICD-10-CM | POA: Diagnosis not present

## 2019-05-27 DIAGNOSIS — J301 Allergic rhinitis due to pollen: Secondary | ICD-10-CM | POA: Diagnosis not present

## 2019-05-27 DIAGNOSIS — J3089 Other allergic rhinitis: Secondary | ICD-10-CM | POA: Diagnosis not present

## 2019-06-03 DIAGNOSIS — J3089 Other allergic rhinitis: Secondary | ICD-10-CM | POA: Diagnosis not present

## 2019-06-03 DIAGNOSIS — J301 Allergic rhinitis due to pollen: Secondary | ICD-10-CM | POA: Diagnosis not present

## 2019-06-11 DIAGNOSIS — J301 Allergic rhinitis due to pollen: Secondary | ICD-10-CM | POA: Diagnosis not present

## 2019-06-11 DIAGNOSIS — J3089 Other allergic rhinitis: Secondary | ICD-10-CM | POA: Diagnosis not present

## 2019-06-17 DIAGNOSIS — J301 Allergic rhinitis due to pollen: Secondary | ICD-10-CM | POA: Diagnosis not present

## 2019-06-17 DIAGNOSIS — J3089 Other allergic rhinitis: Secondary | ICD-10-CM | POA: Diagnosis not present

## 2019-06-24 DIAGNOSIS — J3089 Other allergic rhinitis: Secondary | ICD-10-CM | POA: Diagnosis not present

## 2019-06-24 DIAGNOSIS — J301 Allergic rhinitis due to pollen: Secondary | ICD-10-CM | POA: Diagnosis not present

## 2019-07-03 DIAGNOSIS — J3089 Other allergic rhinitis: Secondary | ICD-10-CM | POA: Diagnosis not present

## 2019-07-03 DIAGNOSIS — J301 Allergic rhinitis due to pollen: Secondary | ICD-10-CM | POA: Diagnosis not present

## 2019-07-10 DIAGNOSIS — J301 Allergic rhinitis due to pollen: Secondary | ICD-10-CM | POA: Diagnosis not present

## 2019-07-10 DIAGNOSIS — J3089 Other allergic rhinitis: Secondary | ICD-10-CM | POA: Diagnosis not present

## 2019-07-14 ENCOUNTER — Encounter: Payer: Self-pay | Admitting: Family Medicine

## 2019-07-14 DIAGNOSIS — Z6825 Body mass index (BMI) 25.0-25.9, adult: Secondary | ICD-10-CM | POA: Diagnosis not present

## 2019-07-14 DIAGNOSIS — Z1231 Encounter for screening mammogram for malignant neoplasm of breast: Secondary | ICD-10-CM | POA: Diagnosis not present

## 2019-07-14 DIAGNOSIS — Z01419 Encounter for gynecological examination (general) (routine) without abnormal findings: Secondary | ICD-10-CM | POA: Diagnosis not present

## 2019-07-14 LAB — HM MAMMOGRAPHY

## 2019-07-15 ENCOUNTER — Encounter: Payer: Self-pay | Admitting: Family Medicine

## 2019-07-15 DIAGNOSIS — J301 Allergic rhinitis due to pollen: Secondary | ICD-10-CM | POA: Diagnosis not present

## 2019-07-15 DIAGNOSIS — J3089 Other allergic rhinitis: Secondary | ICD-10-CM | POA: Diagnosis not present

## 2019-07-21 DIAGNOSIS — J301 Allergic rhinitis due to pollen: Secondary | ICD-10-CM | POA: Diagnosis not present

## 2019-07-21 DIAGNOSIS — J3089 Other allergic rhinitis: Secondary | ICD-10-CM | POA: Diagnosis not present

## 2019-07-28 DIAGNOSIS — L52 Erythema nodosum: Secondary | ICD-10-CM | POA: Diagnosis not present

## 2019-07-28 DIAGNOSIS — L309 Dermatitis, unspecified: Secondary | ICD-10-CM | POA: Diagnosis not present

## 2019-07-28 DIAGNOSIS — D1723 Benign lipomatous neoplasm of skin and subcutaneous tissue of right leg: Secondary | ICD-10-CM | POA: Diagnosis not present

## 2019-07-29 DIAGNOSIS — J301 Allergic rhinitis due to pollen: Secondary | ICD-10-CM | POA: Diagnosis not present

## 2019-07-29 DIAGNOSIS — J3089 Other allergic rhinitis: Secondary | ICD-10-CM | POA: Diagnosis not present

## 2019-08-07 DIAGNOSIS — J301 Allergic rhinitis due to pollen: Secondary | ICD-10-CM | POA: Diagnosis not present

## 2019-08-07 DIAGNOSIS — J3089 Other allergic rhinitis: Secondary | ICD-10-CM | POA: Diagnosis not present

## 2019-08-13 DIAGNOSIS — J301 Allergic rhinitis due to pollen: Secondary | ICD-10-CM | POA: Diagnosis not present

## 2019-08-13 DIAGNOSIS — J3089 Other allergic rhinitis: Secondary | ICD-10-CM | POA: Diagnosis not present

## 2019-08-18 DIAGNOSIS — J3089 Other allergic rhinitis: Secondary | ICD-10-CM | POA: Diagnosis not present

## 2019-08-18 DIAGNOSIS — J301 Allergic rhinitis due to pollen: Secondary | ICD-10-CM | POA: Diagnosis not present

## 2019-08-25 DIAGNOSIS — J3089 Other allergic rhinitis: Secondary | ICD-10-CM | POA: Diagnosis not present

## 2019-08-25 DIAGNOSIS — J301 Allergic rhinitis due to pollen: Secondary | ICD-10-CM | POA: Diagnosis not present

## 2019-09-03 DIAGNOSIS — J3089 Other allergic rhinitis: Secondary | ICD-10-CM | POA: Diagnosis not present

## 2019-09-03 DIAGNOSIS — J301 Allergic rhinitis due to pollen: Secondary | ICD-10-CM | POA: Diagnosis not present

## 2019-09-08 DIAGNOSIS — J301 Allergic rhinitis due to pollen: Secondary | ICD-10-CM | POA: Diagnosis not present

## 2019-09-09 DIAGNOSIS — J3089 Other allergic rhinitis: Secondary | ICD-10-CM | POA: Diagnosis not present

## 2019-09-11 DIAGNOSIS — J3089 Other allergic rhinitis: Secondary | ICD-10-CM | POA: Diagnosis not present

## 2019-09-11 DIAGNOSIS — J301 Allergic rhinitis due to pollen: Secondary | ICD-10-CM | POA: Diagnosis not present

## 2019-09-17 DIAGNOSIS — D171 Benign lipomatous neoplasm of skin and subcutaneous tissue of trunk: Secondary | ICD-10-CM | POA: Diagnosis not present

## 2019-09-17 DIAGNOSIS — J3089 Other allergic rhinitis: Secondary | ICD-10-CM | POA: Diagnosis not present

## 2019-09-17 DIAGNOSIS — J301 Allergic rhinitis due to pollen: Secondary | ICD-10-CM | POA: Diagnosis not present

## 2019-09-25 DIAGNOSIS — J3089 Other allergic rhinitis: Secondary | ICD-10-CM | POA: Diagnosis not present

## 2019-09-25 DIAGNOSIS — J301 Allergic rhinitis due to pollen: Secondary | ICD-10-CM | POA: Diagnosis not present

## 2019-10-02 DIAGNOSIS — J3089 Other allergic rhinitis: Secondary | ICD-10-CM | POA: Diagnosis not present

## 2019-10-02 DIAGNOSIS — J301 Allergic rhinitis due to pollen: Secondary | ICD-10-CM | POA: Diagnosis not present

## 2019-10-07 DIAGNOSIS — J3089 Other allergic rhinitis: Secondary | ICD-10-CM | POA: Diagnosis not present

## 2019-10-07 DIAGNOSIS — J301 Allergic rhinitis due to pollen: Secondary | ICD-10-CM | POA: Diagnosis not present

## 2019-10-20 DIAGNOSIS — J301 Allergic rhinitis due to pollen: Secondary | ICD-10-CM | POA: Diagnosis not present

## 2019-10-20 DIAGNOSIS — J3089 Other allergic rhinitis: Secondary | ICD-10-CM | POA: Diagnosis not present

## 2019-10-27 DIAGNOSIS — J301 Allergic rhinitis due to pollen: Secondary | ICD-10-CM | POA: Diagnosis not present

## 2019-10-27 DIAGNOSIS — R05 Cough: Secondary | ICD-10-CM | POA: Diagnosis not present

## 2019-10-27 DIAGNOSIS — J3089 Other allergic rhinitis: Secondary | ICD-10-CM | POA: Diagnosis not present

## 2019-10-27 DIAGNOSIS — R21 Rash and other nonspecific skin eruption: Secondary | ICD-10-CM | POA: Diagnosis not present

## 2019-11-04 DIAGNOSIS — J3089 Other allergic rhinitis: Secondary | ICD-10-CM | POA: Diagnosis not present

## 2019-11-04 DIAGNOSIS — J301 Allergic rhinitis due to pollen: Secondary | ICD-10-CM | POA: Diagnosis not present

## 2019-11-12 DIAGNOSIS — J3081 Allergic rhinitis due to animal (cat) (dog) hair and dander: Secondary | ICD-10-CM | POA: Diagnosis not present

## 2019-11-12 DIAGNOSIS — J301 Allergic rhinitis due to pollen: Secondary | ICD-10-CM | POA: Diagnosis not present

## 2019-11-12 DIAGNOSIS — J3089 Other allergic rhinitis: Secondary | ICD-10-CM | POA: Diagnosis not present

## 2019-11-18 DIAGNOSIS — J3089 Other allergic rhinitis: Secondary | ICD-10-CM | POA: Diagnosis not present

## 2019-11-18 DIAGNOSIS — J301 Allergic rhinitis due to pollen: Secondary | ICD-10-CM | POA: Diagnosis not present

## 2019-11-25 DIAGNOSIS — J301 Allergic rhinitis due to pollen: Secondary | ICD-10-CM | POA: Diagnosis not present

## 2019-11-25 DIAGNOSIS — J3089 Other allergic rhinitis: Secondary | ICD-10-CM | POA: Diagnosis not present

## 2019-12-04 DIAGNOSIS — J301 Allergic rhinitis due to pollen: Secondary | ICD-10-CM | POA: Diagnosis not present

## 2019-12-04 DIAGNOSIS — J3089 Other allergic rhinitis: Secondary | ICD-10-CM | POA: Diagnosis not present

## 2019-12-09 DIAGNOSIS — J3089 Other allergic rhinitis: Secondary | ICD-10-CM | POA: Diagnosis not present

## 2019-12-09 DIAGNOSIS — J301 Allergic rhinitis due to pollen: Secondary | ICD-10-CM | POA: Diagnosis not present

## 2019-12-15 DIAGNOSIS — H5213 Myopia, bilateral: Secondary | ICD-10-CM | POA: Diagnosis not present

## 2019-12-15 DIAGNOSIS — H04123 Dry eye syndrome of bilateral lacrimal glands: Secondary | ICD-10-CM | POA: Diagnosis not present

## 2019-12-16 DIAGNOSIS — J301 Allergic rhinitis due to pollen: Secondary | ICD-10-CM | POA: Diagnosis not present

## 2019-12-16 DIAGNOSIS — J3089 Other allergic rhinitis: Secondary | ICD-10-CM | POA: Diagnosis not present

## 2019-12-23 DIAGNOSIS — J3081 Allergic rhinitis due to animal (cat) (dog) hair and dander: Secondary | ICD-10-CM | POA: Diagnosis not present

## 2019-12-23 DIAGNOSIS — J3089 Other allergic rhinitis: Secondary | ICD-10-CM | POA: Diagnosis not present

## 2019-12-23 DIAGNOSIS — J301 Allergic rhinitis due to pollen: Secondary | ICD-10-CM | POA: Diagnosis not present

## 2019-12-29 DIAGNOSIS — J301 Allergic rhinitis due to pollen: Secondary | ICD-10-CM | POA: Diagnosis not present

## 2019-12-29 DIAGNOSIS — J3089 Other allergic rhinitis: Secondary | ICD-10-CM | POA: Diagnosis not present

## 2020-01-08 DIAGNOSIS — J3089 Other allergic rhinitis: Secondary | ICD-10-CM | POA: Diagnosis not present

## 2020-01-08 DIAGNOSIS — J301 Allergic rhinitis due to pollen: Secondary | ICD-10-CM | POA: Diagnosis not present

## 2020-01-26 DIAGNOSIS — J3089 Other allergic rhinitis: Secondary | ICD-10-CM | POA: Diagnosis not present

## 2020-01-26 DIAGNOSIS — J301 Allergic rhinitis due to pollen: Secondary | ICD-10-CM | POA: Diagnosis not present

## 2020-02-11 DIAGNOSIS — J301 Allergic rhinitis due to pollen: Secondary | ICD-10-CM | POA: Diagnosis not present

## 2020-02-11 DIAGNOSIS — J3089 Other allergic rhinitis: Secondary | ICD-10-CM | POA: Diagnosis not present

## 2020-02-23 DIAGNOSIS — J3089 Other allergic rhinitis: Secondary | ICD-10-CM | POA: Diagnosis not present

## 2020-02-23 DIAGNOSIS — J301 Allergic rhinitis due to pollen: Secondary | ICD-10-CM | POA: Diagnosis not present

## 2020-03-09 DIAGNOSIS — J3089 Other allergic rhinitis: Secondary | ICD-10-CM | POA: Diagnosis not present

## 2020-03-09 DIAGNOSIS — J301 Allergic rhinitis due to pollen: Secondary | ICD-10-CM | POA: Diagnosis not present

## 2020-03-09 DIAGNOSIS — J3081 Allergic rhinitis due to animal (cat) (dog) hair and dander: Secondary | ICD-10-CM | POA: Diagnosis not present

## 2020-03-25 DIAGNOSIS — J301 Allergic rhinitis due to pollen: Secondary | ICD-10-CM | POA: Diagnosis not present

## 2020-03-25 DIAGNOSIS — J3089 Other allergic rhinitis: Secondary | ICD-10-CM | POA: Diagnosis not present

## 2020-04-04 DIAGNOSIS — J301 Allergic rhinitis due to pollen: Secondary | ICD-10-CM | POA: Diagnosis not present

## 2020-04-05 DIAGNOSIS — J3089 Other allergic rhinitis: Secondary | ICD-10-CM | POA: Diagnosis not present

## 2020-04-08 DIAGNOSIS — J3089 Other allergic rhinitis: Secondary | ICD-10-CM | POA: Diagnosis not present

## 2020-04-08 DIAGNOSIS — J301 Allergic rhinitis due to pollen: Secondary | ICD-10-CM | POA: Diagnosis not present

## 2020-04-22 DIAGNOSIS — J3089 Other allergic rhinitis: Secondary | ICD-10-CM | POA: Diagnosis not present

## 2020-04-22 DIAGNOSIS — J301 Allergic rhinitis due to pollen: Secondary | ICD-10-CM | POA: Diagnosis not present

## 2020-05-06 DIAGNOSIS — J3089 Other allergic rhinitis: Secondary | ICD-10-CM | POA: Diagnosis not present

## 2020-05-06 DIAGNOSIS — J301 Allergic rhinitis due to pollen: Secondary | ICD-10-CM | POA: Diagnosis not present

## 2020-05-16 DIAGNOSIS — J301 Allergic rhinitis due to pollen: Secondary | ICD-10-CM | POA: Diagnosis not present

## 2020-05-16 DIAGNOSIS — J3089 Other allergic rhinitis: Secondary | ICD-10-CM | POA: Diagnosis not present

## 2020-05-27 DIAGNOSIS — J3089 Other allergic rhinitis: Secondary | ICD-10-CM | POA: Diagnosis not present

## 2020-05-27 DIAGNOSIS — J301 Allergic rhinitis due to pollen: Secondary | ICD-10-CM | POA: Diagnosis not present

## 2020-06-03 DIAGNOSIS — J301 Allergic rhinitis due to pollen: Secondary | ICD-10-CM | POA: Diagnosis not present

## 2020-06-03 DIAGNOSIS — J3089 Other allergic rhinitis: Secondary | ICD-10-CM | POA: Diagnosis not present

## 2020-06-15 DIAGNOSIS — J3089 Other allergic rhinitis: Secondary | ICD-10-CM | POA: Diagnosis not present

## 2020-06-15 DIAGNOSIS — J301 Allergic rhinitis due to pollen: Secondary | ICD-10-CM | POA: Diagnosis not present

## 2020-06-23 DIAGNOSIS — J301 Allergic rhinitis due to pollen: Secondary | ICD-10-CM | POA: Diagnosis not present

## 2020-06-23 DIAGNOSIS — J3089 Other allergic rhinitis: Secondary | ICD-10-CM | POA: Diagnosis not present

## 2020-07-05 DIAGNOSIS — J301 Allergic rhinitis due to pollen: Secondary | ICD-10-CM | POA: Diagnosis not present

## 2020-07-05 DIAGNOSIS — J3089 Other allergic rhinitis: Secondary | ICD-10-CM | POA: Diagnosis not present

## 2020-07-14 DIAGNOSIS — J3089 Other allergic rhinitis: Secondary | ICD-10-CM | POA: Diagnosis not present

## 2020-07-14 DIAGNOSIS — J301 Allergic rhinitis due to pollen: Secondary | ICD-10-CM | POA: Diagnosis not present

## 2020-08-01 DIAGNOSIS — J301 Allergic rhinitis due to pollen: Secondary | ICD-10-CM | POA: Diagnosis not present

## 2020-08-01 DIAGNOSIS — J3089 Other allergic rhinitis: Secondary | ICD-10-CM | POA: Diagnosis not present

## 2020-08-11 DIAGNOSIS — J3089 Other allergic rhinitis: Secondary | ICD-10-CM | POA: Diagnosis not present

## 2020-08-11 DIAGNOSIS — J301 Allergic rhinitis due to pollen: Secondary | ICD-10-CM | POA: Diagnosis not present

## 2020-08-18 DIAGNOSIS — J301 Allergic rhinitis due to pollen: Secondary | ICD-10-CM | POA: Diagnosis not present

## 2020-08-18 DIAGNOSIS — J3089 Other allergic rhinitis: Secondary | ICD-10-CM | POA: Diagnosis not present

## 2020-09-06 DIAGNOSIS — J301 Allergic rhinitis due to pollen: Secondary | ICD-10-CM | POA: Diagnosis not present

## 2020-09-06 DIAGNOSIS — J3089 Other allergic rhinitis: Secondary | ICD-10-CM | POA: Diagnosis not present

## 2020-10-05 DIAGNOSIS — J301 Allergic rhinitis due to pollen: Secondary | ICD-10-CM | POA: Diagnosis not present

## 2020-10-05 DIAGNOSIS — J3089 Other allergic rhinitis: Secondary | ICD-10-CM | POA: Diagnosis not present

## 2020-10-17 DIAGNOSIS — J301 Allergic rhinitis due to pollen: Secondary | ICD-10-CM | POA: Diagnosis not present

## 2020-10-17 DIAGNOSIS — J3089 Other allergic rhinitis: Secondary | ICD-10-CM | POA: Diagnosis not present

## 2020-10-24 DIAGNOSIS — Z01419 Encounter for gynecological examination (general) (routine) without abnormal findings: Secondary | ICD-10-CM | POA: Diagnosis not present

## 2020-10-24 DIAGNOSIS — Z124 Encounter for screening for malignant neoplasm of cervix: Secondary | ICD-10-CM | POA: Diagnosis not present

## 2020-10-24 DIAGNOSIS — Z6824 Body mass index (BMI) 24.0-24.9, adult: Secondary | ICD-10-CM | POA: Diagnosis not present

## 2020-10-24 DIAGNOSIS — Z1231 Encounter for screening mammogram for malignant neoplasm of breast: Secondary | ICD-10-CM | POA: Diagnosis not present

## 2020-10-31 DIAGNOSIS — J301 Allergic rhinitis due to pollen: Secondary | ICD-10-CM | POA: Diagnosis not present

## 2020-10-31 DIAGNOSIS — R21 Rash and other nonspecific skin eruption: Secondary | ICD-10-CM | POA: Diagnosis not present

## 2020-10-31 DIAGNOSIS — J3089 Other allergic rhinitis: Secondary | ICD-10-CM | POA: Diagnosis not present

## 2020-10-31 DIAGNOSIS — R059 Cough, unspecified: Secondary | ICD-10-CM | POA: Diagnosis not present

## 2020-11-10 DIAGNOSIS — J3089 Other allergic rhinitis: Secondary | ICD-10-CM | POA: Diagnosis not present

## 2020-11-10 DIAGNOSIS — J301 Allergic rhinitis due to pollen: Secondary | ICD-10-CM | POA: Diagnosis not present

## 2020-11-10 DIAGNOSIS — J3081 Allergic rhinitis due to animal (cat) (dog) hair and dander: Secondary | ICD-10-CM | POA: Diagnosis not present

## 2020-11-17 DIAGNOSIS — J3089 Other allergic rhinitis: Secondary | ICD-10-CM | POA: Diagnosis not present

## 2020-11-17 DIAGNOSIS — J301 Allergic rhinitis due to pollen: Secondary | ICD-10-CM | POA: Diagnosis not present

## 2020-11-25 DIAGNOSIS — J3089 Other allergic rhinitis: Secondary | ICD-10-CM | POA: Diagnosis not present

## 2020-11-25 DIAGNOSIS — J301 Allergic rhinitis due to pollen: Secondary | ICD-10-CM | POA: Diagnosis not present

## 2020-12-01 DIAGNOSIS — J301 Allergic rhinitis due to pollen: Secondary | ICD-10-CM | POA: Diagnosis not present

## 2020-12-01 DIAGNOSIS — J3089 Other allergic rhinitis: Secondary | ICD-10-CM | POA: Diagnosis not present

## 2020-12-06 DIAGNOSIS — J3089 Other allergic rhinitis: Secondary | ICD-10-CM | POA: Diagnosis not present

## 2020-12-06 DIAGNOSIS — J301 Allergic rhinitis due to pollen: Secondary | ICD-10-CM | POA: Diagnosis not present

## 2020-12-15 DIAGNOSIS — J3089 Other allergic rhinitis: Secondary | ICD-10-CM | POA: Diagnosis not present

## 2020-12-15 DIAGNOSIS — J301 Allergic rhinitis due to pollen: Secondary | ICD-10-CM | POA: Diagnosis not present

## 2020-12-22 DIAGNOSIS — J301 Allergic rhinitis due to pollen: Secondary | ICD-10-CM | POA: Diagnosis not present

## 2020-12-22 DIAGNOSIS — J3089 Other allergic rhinitis: Secondary | ICD-10-CM | POA: Diagnosis not present

## 2020-12-26 ENCOUNTER — Ambulatory Visit: Payer: BC Managed Care – PPO | Admitting: Family Medicine

## 2020-12-26 ENCOUNTER — Other Ambulatory Visit: Payer: Self-pay

## 2020-12-26 ENCOUNTER — Encounter: Payer: Self-pay | Admitting: Family Medicine

## 2020-12-26 VITALS — BP 116/77 | HR 76 | Temp 98.3°F | Ht 70.0 in | Wt 165.4 lb

## 2020-12-26 DIAGNOSIS — H60391 Other infective otitis externa, right ear: Secondary | ICD-10-CM | POA: Diagnosis not present

## 2020-12-26 MED ORDER — CLOTRIMAZOLE 1 % EX SOLN: CUTANEOUS | 4 refills | Status: AC
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
## 2020-12-30 DIAGNOSIS — J301 Allergic rhinitis due to pollen: Secondary | ICD-10-CM | POA: Diagnosis not present

## 2020-12-30 DIAGNOSIS — J3089 Other allergic rhinitis: Secondary | ICD-10-CM | POA: Diagnosis not present

## 2021-01-06 DIAGNOSIS — J301 Allergic rhinitis due to pollen: Secondary | ICD-10-CM | POA: Diagnosis not present

## 2021-01-06 DIAGNOSIS — J3089 Other allergic rhinitis: Secondary | ICD-10-CM | POA: Diagnosis not present

## 2021-01-19 DIAGNOSIS — J3089 Other allergic rhinitis: Secondary | ICD-10-CM | POA: Diagnosis not present

## 2021-01-19 DIAGNOSIS — J301 Allergic rhinitis due to pollen: Secondary | ICD-10-CM | POA: Diagnosis not present

## 2021-01-27 DIAGNOSIS — J301 Allergic rhinitis due to pollen: Secondary | ICD-10-CM | POA: Diagnosis not present

## 2021-01-27 DIAGNOSIS — J3089 Other allergic rhinitis: Secondary | ICD-10-CM | POA: Diagnosis not present

## 2021-02-21 DIAGNOSIS — J301 Allergic rhinitis due to pollen: Secondary | ICD-10-CM | POA: Diagnosis not present

## 2021-02-21 DIAGNOSIS — J3089 Other allergic rhinitis: Secondary | ICD-10-CM | POA: Diagnosis not present

## 2021-03-02 DIAGNOSIS — J3089 Other allergic rhinitis: Secondary | ICD-10-CM | POA: Diagnosis not present

## 2021-03-02 DIAGNOSIS — J301 Allergic rhinitis due to pollen: Secondary | ICD-10-CM | POA: Diagnosis not present

## 2021-03-16 DIAGNOSIS — J301 Allergic rhinitis due to pollen: Secondary | ICD-10-CM | POA: Diagnosis not present

## 2021-03-16 DIAGNOSIS — J3089 Other allergic rhinitis: Secondary | ICD-10-CM | POA: Diagnosis not present

## 2021-03-24 DIAGNOSIS — J3089 Other allergic rhinitis: Secondary | ICD-10-CM | POA: Diagnosis not present

## 2021-03-24 DIAGNOSIS — J301 Allergic rhinitis due to pollen: Secondary | ICD-10-CM | POA: Diagnosis not present

## 2021-03-29 DIAGNOSIS — J3089 Other allergic rhinitis: Secondary | ICD-10-CM | POA: Diagnosis not present

## 2021-03-29 DIAGNOSIS — J301 Allergic rhinitis due to pollen: Secondary | ICD-10-CM | POA: Diagnosis not present

## 2021-04-06 DIAGNOSIS — J3089 Other allergic rhinitis: Secondary | ICD-10-CM | POA: Diagnosis not present

## 2021-04-06 DIAGNOSIS — J301 Allergic rhinitis due to pollen: Secondary | ICD-10-CM | POA: Diagnosis not present

## 2021-04-10 DIAGNOSIS — J301 Allergic rhinitis due to pollen: Secondary | ICD-10-CM | POA: Diagnosis not present

## 2021-04-11 DIAGNOSIS — J3089 Other allergic rhinitis: Secondary | ICD-10-CM | POA: Diagnosis not present

## 2021-04-19 DIAGNOSIS — J301 Allergic rhinitis due to pollen: Secondary | ICD-10-CM | POA: Diagnosis not present

## 2021-04-19 DIAGNOSIS — J3089 Other allergic rhinitis: Secondary | ICD-10-CM | POA: Diagnosis not present

## 2021-04-28 DIAGNOSIS — J301 Allergic rhinitis due to pollen: Secondary | ICD-10-CM | POA: Diagnosis not present

## 2021-04-28 DIAGNOSIS — J3089 Other allergic rhinitis: Secondary | ICD-10-CM | POA: Diagnosis not present

## 2021-11-06 LAB — HM MAMMOGRAPHY

## 2023-03-26 LAB — HM MAMMOGRAPHY

## 2023-04-21 LAB — COLOGUARD: COLOGUARD: NEGATIVE
